# Patient Record
Sex: Female | Born: 1940 | Race: White | Hispanic: No | Marital: Married | Smoking: Never smoker
Health system: Southern US, Community
[De-identification: ages and names within clinical notes are randomized; demographics above are authoritative.]

## PROBLEM LIST (undated history)

## (undated) HISTORY — PX: BREAST EXCISIONAL BIOPSY: SUR124

---

## 1998-08-24 ENCOUNTER — Ambulatory Visit (HOSPITAL_BASED_OUTPATIENT_CLINIC_OR_DEPARTMENT_OTHER): Admission: RE | Admit: 1998-08-24 | Discharge: 1998-08-24 | Payer: Self-pay | Admitting: Orthopedic Surgery

## 1999-06-17 ENCOUNTER — Other Ambulatory Visit: Admission: RE | Admit: 1999-06-17 | Discharge: 1999-06-17 | Payer: Self-pay | Admitting: Obstetrics and Gynecology

## 1999-10-31 ENCOUNTER — Encounter: Payer: Self-pay | Admitting: Gastroenterology

## 1999-10-31 ENCOUNTER — Encounter: Admission: RE | Admit: 1999-10-31 | Discharge: 1999-10-31 | Payer: Self-pay | Admitting: Gastroenterology

## 2000-11-03 ENCOUNTER — Encounter: Payer: Self-pay | Admitting: Gastroenterology

## 2000-11-03 ENCOUNTER — Encounter: Admission: RE | Admit: 2000-11-03 | Discharge: 2000-11-03 | Payer: Self-pay | Admitting: Gastroenterology

## 2000-11-04 ENCOUNTER — Encounter: Admission: RE | Admit: 2000-11-04 | Discharge: 2000-11-04 | Payer: Self-pay | Admitting: Internal Medicine

## 2000-11-04 ENCOUNTER — Encounter: Payer: Self-pay | Admitting: Gastroenterology

## 2001-11-15 ENCOUNTER — Encounter: Admission: RE | Admit: 2001-11-15 | Discharge: 2001-11-15 | Payer: Self-pay | Admitting: Gastroenterology

## 2001-11-15 ENCOUNTER — Encounter: Payer: Self-pay | Admitting: Gastroenterology

## 2002-11-17 ENCOUNTER — Encounter: Payer: Self-pay | Admitting: Gastroenterology

## 2002-11-17 ENCOUNTER — Encounter: Admission: RE | Admit: 2002-11-17 | Discharge: 2002-11-17 | Payer: Self-pay | Admitting: Gastroenterology

## 2003-11-21 ENCOUNTER — Encounter: Admission: RE | Admit: 2003-11-21 | Discharge: 2003-11-21 | Payer: Self-pay | Admitting: Gastroenterology

## 2004-12-10 ENCOUNTER — Encounter: Admission: RE | Admit: 2004-12-10 | Discharge: 2004-12-10 | Payer: Self-pay | Admitting: Gastroenterology

## 2005-12-16 ENCOUNTER — Encounter: Admission: RE | Admit: 2005-12-16 | Discharge: 2005-12-16 | Payer: Self-pay | Admitting: Gastroenterology

## 2006-12-22 ENCOUNTER — Encounter: Admission: RE | Admit: 2006-12-22 | Discharge: 2006-12-22 | Payer: Self-pay | Admitting: Gastroenterology

## 2007-12-28 ENCOUNTER — Encounter: Admission: RE | Admit: 2007-12-28 | Discharge: 2007-12-28 | Payer: Self-pay | Admitting: Gastroenterology

## 2007-12-30 ENCOUNTER — Encounter: Admission: RE | Admit: 2007-12-30 | Discharge: 2007-12-30 | Payer: Self-pay | Admitting: Gastroenterology

## 2008-09-27 ENCOUNTER — Inpatient Hospital Stay (HOSPITAL_COMMUNITY): Admission: RE | Admit: 2008-09-27 | Discharge: 2008-10-04 | Payer: Self-pay | Admitting: Orthopedic Surgery

## 2009-01-16 ENCOUNTER — Encounter: Admission: RE | Admit: 2009-01-16 | Discharge: 2009-01-16 | Payer: Self-pay | Admitting: Internal Medicine

## 2010-01-17 ENCOUNTER — Encounter
Admission: RE | Admit: 2010-01-17 | Discharge: 2010-01-17 | Payer: Self-pay | Source: Home / Self Care | Admitting: Internal Medicine

## 2010-03-03 ENCOUNTER — Encounter: Payer: Self-pay | Admitting: Gastroenterology

## 2010-05-18 LAB — HEMOGLOBIN AND HEMATOCRIT, BLOOD
HCT: 25.4 % — ABNORMAL LOW (ref 36.0–46.0)
HCT: 26.5 % — ABNORMAL LOW (ref 36.0–46.0)
HCT: 27.9 % — ABNORMAL LOW (ref 36.0–46.0)
HCT: 29 % — ABNORMAL LOW (ref 36.0–46.0)
Hemoglobin: 11 g/dL — ABNORMAL LOW (ref 12.0–15.0)
Hemoglobin: 8.6 g/dL — ABNORMAL LOW (ref 12.0–15.0)
Hemoglobin: 8.7 g/dL — ABNORMAL LOW (ref 12.0–15.0)
Hemoglobin: 9 g/dL — ABNORMAL LOW (ref 12.0–15.0)
Hemoglobin: 9.5 g/dL — ABNORMAL LOW (ref 12.0–15.0)
Hemoglobin: 9.8 g/dL — ABNORMAL LOW (ref 12.0–15.0)

## 2010-05-18 LAB — PROTIME-INR
INR: 1.2 (ref 0.00–1.49)
Prothrombin Time: 15 seconds (ref 11.6–15.2)
Prothrombin Time: 27.8 seconds — ABNORMAL HIGH (ref 11.6–15.2)

## 2010-05-19 LAB — URINALYSIS, ROUTINE W REFLEX MICROSCOPIC
Glucose, UA: NEGATIVE mg/dL
Ketones, ur: NEGATIVE mg/dL
Nitrite: NEGATIVE
Specific Gravity, Urine: 1.021 (ref 1.005–1.030)
pH: 7 (ref 5.0–8.0)

## 2010-05-19 LAB — URINE CULTURE
Colony Count: 40000
Special Requests: NEGATIVE

## 2010-05-19 LAB — COMPREHENSIVE METABOLIC PANEL
AST: 29 U/L (ref 0–37)
Albumin: 3.8 g/dL (ref 3.5–5.2)
BUN: 21 mg/dL (ref 6–23)
Calcium: 9.1 mg/dL (ref 8.4–10.5)
Chloride: 108 mEq/L (ref 96–112)
Creatinine, Ser: 0.65 mg/dL (ref 0.4–1.2)
GFR calc Af Amer: >60 mL/min (ref >60)
Total Bilirubin: 0.5 mg/dL (ref 0.3–1.2)

## 2010-05-19 LAB — DIFFERENTIAL
Basophils Absolute: 0 10*3/uL (ref 0.0–0.1)
Lymphocytes Relative: 24 % (ref 12–46)
Lymphs Abs: 1.8 10*3/uL (ref 0.7–4.0)
Monocytes Absolute: 0.7 10*3/uL (ref 0.1–1.0)
Neutro Abs: 4.7 10*3/uL (ref 1.7–7.7)

## 2010-05-19 LAB — CBC
HCT: 44 % (ref 36.0–46.0)
MCHC: 33.4 g/dL (ref 30.0–36.0)
MCV: 93.2 fL (ref 78.0–100.0)
Platelets: 241 10*3/uL (ref 150–400)
RDW: 12.5 % (ref 11.5–15.5)
WBC: 7.4 10*3/uL (ref 4.0–10.5)

## 2010-05-19 LAB — PROTIME-INR: Prothrombin Time: 12.2 seconds (ref 11.6–15.2)

## 2010-05-19 LAB — APTT: aPTT: 27 seconds (ref 24–37)

## 2010-06-07 ENCOUNTER — Other Ambulatory Visit: Payer: Self-pay | Admitting: Dermatology

## 2010-06-25 NOTE — Op Note (Signed)
Sheri Blevins, Sheri Blevins                 ACCOUNT NO.:  [id number]   MEDICAL RECORD NO.:  [id number]          PATIENT TYPE:  INP   LOCATION:  0003                         FACILITY:  [facility]   PHYSICIAN:  Georges Lynch. Gioffre, M.D.DATE OF BIRTH:  [birth year]   DATE OF PROCEDURE:  09/27/2008  DATE OF DISCHARGE:                               OPERATIVE REPORT   SURGEON:  Georges Lynch. [name], M.D.   ASSISTANT:  Madlyn Frankel. Charlann Boxer, M.D.   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   OPERATION:  Right total hip arthroplasty utilizing the DePuy system.  I  utilized a size 2 Trilock femoral stem.  The pinnacle cup was a size 50-  mm cup with a 32-mm insert.  The insert was a polyethylene insert as I  mentioned and we did use the hole eliminator in the pinnacle cup.  The  pinnacle cup was affixed to one 6.5-mm cancellus screw.  The femoral  head was a ceramic head at +1, 32 mm diameter.  So basically we did a  ceramic head on a polyethylene liner.   PROCEDURE:  Under general anesthesia, the patient on left side, right  side up.  She had 600 mg of clindamycin IV.  At this time a sterile prep  and draping of the right hip was carried out.  Incision was made over  the posterolateral aspect of the right hip in the usual fashion.  The  self-retaining retractors were inserted.  The incision was carried down  to the iliotibial band.  The  iliotibial band was incised over the  greater trochanter and the muscle was dissected by blunt dissection  proximally.  I then exposed the external rotators and at this time I  partially detached the external rotators.  I then went down and  identified the femoral capsule.  I incised the capsule, dislocated the  femoral head, amputated the femoral head at the appropriate neck length.  I then utilized my box chisel to remove the cancellus  bone from the  greater trochanter.  I then used a widening reamer and then a canal  finder.  Following that, I then rasped the canal up to a size 2 Trilock  stem.  Following that, we then directed attention to the acetabulum and  reamed the acetabulum up to a size 49 for a 52-mm cup.  We then inserted  our pinnacle cup which was a 52-mm cup with 1 cancellus screw for  fixation.  We then inserted our trial liner, went through range of  motion and had excellent stability.  We then selected a standard Trilock  stem, size 2.  We selected that as a permanent stem and we then inserted  our permanent polyethylene liner.  Prior to inserting the liner, we did  insert the hole eliminator.  We then inserted our permanent Trilock  femoral stem, size 2 standard.  I then applied the ceramic head to the  Trilock stem and the ceramic head measured  a +1.  The diameter was a 32  mm diameter.  We reduced the hip after we made sure there was no soft  tissue into the acetabulum.  We irrigated the area out, reduced the hip  nicely and then closed the wound in layers in the usual fashion.  Note,  during the entire procedure with the trials and the final stem, we made  sure we had the appropriate neck length and stability.  The wound was  closed in layers in the usual fashion.  Sterile dressings were applied.  The patient left the operating room in satisfactory condition.   ESTIMATED BLOOD LOSS:  500 mL.           ______________________________  Georges Lynch. [name], M.D.     RAG/MEDQ  D:  09/27/2008  T:  09/27/2008  Job:  [id number]   cc:   Tasia Catchings, M.D.  Fax: [phone]

## 2010-06-25 NOTE — H&P (Signed)
NAME:  Sheri Blevins, Sheri Blevins                 ACCOUNT NO.:  [id number]   MEDICAL RECORD NO.:  [id number]           PATIENT TYPE:  INP   LOCATION:                               FACILITY:  [facility]   PHYSICIAN:  Georges Lynch. Gioffre, M.D.DATE OF BIRTH:  March 16, 1940   DATE OF ADMISSION:  09/27/2008  DATE OF DISCHARGE:                              HISTORY & PHYSICAL   CHIEF COMPLAINT:  Right hip pain,   HISTORY OF PRESENT ILLNESS:  Sheri Blevins came be seen Dr. Darrelyn Blevins for a  second opinion regarding pain in her right hip that was progressing and  causing pain to radiate down her legs as well as into her groin.  On  physical exam Dr. Darrelyn Blevins found that the patient had marked painful  limited range of motion of her right hip.  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  The patient now presents for a right total hip  arthroplasty.   ALLERGIES:  1. CODEINE.  2. AMOXICILLIN.  3. DARVON.  4. The patient states that she does have an allergy to LATEX.  The      patient denies any food or metal allergies.   CURRENT MEDICATIONS:  1. Paxil CR. The patient takes one tablet daily.  2. Note patient also takes many supplements.  I have discussed with      her that she needs to stop all of these seven days prior to the      surgery and remain off of them for three weeks post surgery until      she is off of Coumadin.   PRIMARY CARE PHYSICIAN:  Dr. Kevan Ny.   REVIEW OF SYSTEMS:  GENERAL  The patient denies fevers, weight change,  or night sweats.  HEENT/NEUROLOGIC:  The patient denies headache.  The  patient denies any balance problems or dizziness.  RESPIRATORY:  The  patient denies any shortness of breath, cough or wheezing.  CARDIOVASCULAR:  The patient denies chest pain, palpitations or  difficulty with breathing while lying flat.  GASTROINTESTINAL: The  patient  denies any nausea, vomiting, diarrhea, or blood in her stool.  MUSCULOSKELETAL:  The patient admits muscular pain in her low back and  legs.  The patient also admits morning stiffness and muscular weakness.   PAST SURGICAL HISTORY:  1. Colectomy.  About 12 inches of the patient's colon was removed.  2. Oophorectomy.  3. Appendectomy.  4. Total hysterectomy.   FAMILY HISTORY:  Father passed away at age 25 from a blood clot going to  his heart causing myocardial infarction.  Mother, 45, passed away from  pancreatic and stomach cancer.   SOCIAL HISTORY:  The patient is married.  She works at [employer] in  [place].  The patient denies any history of smoking, alcohol,  or drug use.  The patient states that her husband and family friends  will be able to help care for her after the surgery and she  plans to go  home.   PHYSICAL EXAMINATION:  VITAL SIGNS:  Pulse 64, respirations 16, blood  pressure 120/75.  GENERAL: A 70 year old white female, alert and oriented x3.  The patient  is in no acute distress.  She is a good historian.  HEENT: Unremarkable.  NECK: Supple, full range of motion without lymphadenopathy.  CHEST:  Lungs are clear to auscultation bilaterally.  Negative for  wheezing, rales, or rhonchi.  HEART:  Regular rate and rhythm without murmur, rub, or gallop.  ABDOMEN:  soft and nontender.  Bowel sounds present in all four  quadrants.  EXTREMITIES:  Right hip:  Decreased range of motion.  The patient also  has quite a bit of pain while going through range of motion.  The  patient has pain with resisted right hip flexion.  Otherwise the patient  has 5/5 strength in her lower extremities bilaterally.  Sensation is  intact in lower extremities bilaterally.  SKIN:  Unremarkable.  Peripheral vascular carotid pulses are 2+  bilaterally without carotid bruit.  Dorsalis pedis pulses are 2+  bilaterally.   IMPRESSION:  Severe degenerative arthritis of the right  hip.   PLAN:  The patient will undergo all routine labs and tests prior to  having a right total hip arthroplasty by Dr. Darrelyn Blevins to be assisted by  Dr. Charlann Boxer at [facility] on Wednesday, August 18,2010.      Rozell Searing, PAC    ______________________________  Georges Lynch Sheri Blevins, M.D.    LD/MEDQ  D:  09/25/2008  T:  09/25/2008  Job:  [id number]

## 2010-06-25 NOTE — Discharge Summary (Signed)
Sheri Blevins, Sheri Blevins                 ACCOUNT NO.:  [id number]   MEDICAL RECORD NO.:  [id number]          PATIENT TYPE:  INP   LOCATION:  1612                         FACILITY:  [facility]   PHYSICIAN:  Georges Lynch. Gioffre, M.D.DATE OF BIRTH:  06-11-1940   DATE OF ADMISSION:  09/27/2008  DATE OF DISCHARGE:  10/03/2008                               DISCHARGE SUMMARY   ADDENDUM:   PROJECTED DISCHARGE DATE:  October 03, 2008.   HISTORY OF PRESENT ILLNESS AND OPERATIVE REPORT:  See previous discharge  dictation.   DIET AND ACTIVITY:  Also remain the same.  Please see previous dictation  for these, as well.   HOSPITAL COURSE:  The patient had planned discharge to skilled nursing  facility set for October 02, 2008; however, due to insurance reasons, she  is still here.  Per the social worker, she is waiting on prior  authorization from her insurance to be discharged to the skilled nursing  facility.  The patient's vital signs remain stable.  The patient also  continues to do her physical therapy while in the hospital, and she  continues to meet her goals.   LABORATORY DATA:  The patient's hemoglobin and hematocrit remain stable.  Hemoglobin is 9.5, hematocrit 27.9.  The patient's INR is therapeutic at  this time at 2.9.   DISPOSITION:  To skilled nursing facility, hopefully today.   CONDITION ON DISCHARGE:  Doing well.      Sheri Blevins, PAC    ______________________________  Georges Lynch Sheri Blevins, M.D.    LD/MEDQ  D:  10/03/2008  T:  10/03/2008  Job:  [id number]

## 2010-06-25 NOTE — Discharge Summary (Signed)
Blevins, Sheri                 ACCOUNT NO.:  [id number]   MEDICAL RECORD NO.:  [id number]          PATIENT TYPE:  INP   LOCATION:  1612                         [facility]ACILITY:  [facility]   PHYSICIAN:  Georges Lynch. Gioffre, M.D.DATE OF BIRTH:  04-23-1940   DATE OF ADMISSION:  09/27/2008  DATE OF DISCHARGE:                               DISCHARGE SUMMARY   ADMISSION DIAGNOSES:  1. Severe degenerative arthritis right hip.  2. Anemia.  3. Anxiety.   DISCHARGE DIAGNOSES:  1. Severe degenerative arthritis of right hip status post right total      hip arthroplasty.  2. Anemia.  3. Anxiety.   HISTORY OF PRESENT ILLNESS:  Ms.  Sheri Blevins has been seen for Dr. [name] for  a second opinion regarding pain in her right hip that is progressing and  causing pain to radiate down her legs as well as into her groin.  On  physical exam, Dr. [name] found that the patient has marked painful  limited range of motion of her right hip.  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  The patient now presents for a total right hip  arthroplasty.   DRUG ALLERGIES:  1. CODEINE.  2. AMOXICILLIN  3. DARVON.  4. LATEX.   CURRENT MEDICATIONS:  1. Paxil CR 2 mg 1 tablet daily.  2. Ibuprofen 200 mg 1 tablet p.o. q. 4-6 hours p.r.n. pain.  The      patient to stop this medication while she is on Coumadin.  3. Tylenol Extra Strength 1 tablet p.o. q. 4-6 hours p.r.n. pain.  The      patient may continue this.  4. Fiber tablets two in the morning, three at nighttime.  5. Probiotics 2 tablets b.i.d.  6. Vitamin D3.  7. Co-Q 10 1 tablet p.o. in the morning.  8. Multivitamin 1 tablet in the morning.  9. Vitamin B12 1 tablet in the morning.  10.Liver antioxidant 1 tablet in the morning.  11.Calcium 1 tablet at nighttime.  12.Glucosamine 1 tablet p.o. twice daily.  13.Ferrous  sulfate 1 tablet p.o. daily.   SURGICAL PROCEDURES:  On September 27, 2008, the patient was taken to the  operating room by Dr. Ranee Gosselin, assisted by Dr. Durene Romans.  Under general anesthesia, the patient underwent a right total hip  arthroplasty.  The patient had no complications.   CONSULTANTS:  None.   LABORATORY DATA:  The patient's preoperative labs were done on September 21, 2008.  Her vital signs were normal.  The patient's complete blood  count revealed white count of 7.4, hemoglobin of 14.7, hematocrit of 44  and a platelet count of 241.  The patient's chemistry panel was  unremarkable.  The patient's urinalysis was also unremarkable.  Patient's PT/INR revealed protime of 12.2 and INR of 0.9.  throughout  the patient's hospital stay, serial H and H's were taken.  The patient's  hemoglobin dropped to as low as 8.6 on September 30, 2008, but by the  following day has come back up to 8.7 and now today and October 02, 2008,  was up to 9.0 with a hematocrit of 26.5.  The patient did not require  blood transfusion during her stay in the hospital.  The patient's PT/INR  were also drawn daily as she has been placed on Coumadin.  By September 30, 2008, the patient's INR was up to 1.8 with a protime of 21.1.  On October 01, 2008, patient's INR is 2.3 and remains on a therapeutic range today  on October 02, 2008, of 2.8 with a protime of 29.   HOSPITAL COURSE:  On September 27, 2008, the patient was admitted to [facility] under Dr. Libby Maw care.  The patient was taken to  the operating room where a right total hip arthroplasty was performed  without any complications.  The patient was then transferred to the  recovery room and then to the orthopedic floor in good condition  following a total right hip protocol with IV antibiotics of clindamycin  600 mg, clindamycin IV, she received three doses of this.  Patient then  incurred a total of 5 days postoperative care in which  the patient has  progressed nicely with physical therapy.  She was able to transition  from IV medications to p.o. medications without any difficulty.  The  patient's vital signs remained stable and she remains afebrile.  The  patient did develop some mild postoperative anemia, but this was able to  correct itself, and she did not require transfusion.  The patient's  wound remained benign without any signs of infection, well approximated  with staples.  On postoperative day #1, the patient was able to ambulate  24 feet and then again 20 feet using a walker with minimal assistance,  and by postoperative day #3, the patient was able to ambulate 110 feet  using a walker and without assistance.  The patient is also able to  perform transfers of sit-to-stand and stand-to-sit without any  difficulty.   DISCHARGE/PLAN:  1. The patient is to be discharged to skilled nursing facility where      she will continue physical therapy, occupational therapy and      completion of ADLs.  2. The patient's dressing is to be changed daily and to be checked      daily for any signs of infection.  3. Diet.  The patient has no restrictions.  4. Activity.  The patient is full weightbearing with walker with      assistance.   FOLLOWUP:  The patient has scheduled follow-up appointment Dr. [name]  in two weeks from the day of surgery.  She is call [phone] for follow-  up appointment.   DISCHARGE MEDICATIONS:  1. Robaxin 500 mg 1 tablet p.o. t.i.d. p.r.n. muscle spasms.  2. Dilaudid 2 mg 1 tablet p.o. q.4-6 hours p.r.n. pain.  3. Coumadin.  This is to be dosed per the skilled nursing facility      pharmacy.  Therapeutic ranges between 2-3.  4. Paxil CR 2.5 mg one type p.o. daily.  5. Tylenol Extra Strength 1 tablet p.o. q.6 h p.r.n. pain.  6. Fiber tablets 2 in the morning, 3 at night.   CONDITION ON DISCHARGE:  The patient's condition on discharge is listed  as improving.      Rozell Searing,  PAC    ______________________________  Georges Lynch [name], M.D.    LD/MEDQ  D:  10/02/2008  T:  10/02/2008  Job:  [id number]

## 2011-03-26 DIAGNOSIS — E7800 Pure hypercholesterolemia, unspecified: Secondary | ICD-10-CM | POA: Diagnosis not present

## 2011-03-26 DIAGNOSIS — E039 Hypothyroidism, unspecified: Secondary | ICD-10-CM | POA: Diagnosis not present

## 2011-03-26 DIAGNOSIS — F411 Generalized anxiety disorder: Secondary | ICD-10-CM | POA: Diagnosis not present

## 2011-03-26 DIAGNOSIS — M1990 Unspecified osteoarthritis, unspecified site: Secondary | ICD-10-CM | POA: Diagnosis not present

## 2011-04-21 DIAGNOSIS — IMO0002 Reserved for concepts with insufficient information to code with codable children: Secondary | ICD-10-CM | POA: Diagnosis not present

## 2011-05-16 ENCOUNTER — Other Ambulatory Visit: Payer: Self-pay | Admitting: Internal Medicine

## 2011-05-16 DIAGNOSIS — Z1231 Encounter for screening mammogram for malignant neoplasm of breast: Secondary | ICD-10-CM

## 2011-06-18 ENCOUNTER — Ambulatory Visit
Admission: RE | Admit: 2011-06-18 | Discharge: 2011-06-18 | Disposition: A | Discharge status: Home / Self Care | Payer: Medicare Other | Source: Ambulatory Visit | Attending: Internal Medicine | Admitting: Internal Medicine

## 2011-06-18 DIAGNOSIS — Z1231 Encounter for screening mammogram for malignant neoplasm of breast: Secondary | ICD-10-CM | POA: Diagnosis not present

## 2011-07-22 DIAGNOSIS — Z0000 Encounter for general adult medical examination without abnormal findings: Secondary | ICD-10-CM | POA: Diagnosis not present

## 2011-07-22 DIAGNOSIS — Z79899 Other long term (current) drug therapy: Secondary | ICD-10-CM | POA: Diagnosis not present

## 2011-07-22 DIAGNOSIS — Z1331 Encounter for screening for depression: Secondary | ICD-10-CM | POA: Diagnosis not present

## 2011-07-22 DIAGNOSIS — E7800 Pure hypercholesterolemia, unspecified: Secondary | ICD-10-CM | POA: Diagnosis not present

## 2011-07-22 DIAGNOSIS — M2550 Pain in unspecified joint: Secondary | ICD-10-CM | POA: Diagnosis not present

## 2011-07-22 DIAGNOSIS — E039 Hypothyroidism, unspecified: Secondary | ICD-10-CM | POA: Diagnosis not present

## 2011-07-22 DIAGNOSIS — E559 Vitamin D deficiency, unspecified: Secondary | ICD-10-CM | POA: Diagnosis not present

## 2011-07-22 DIAGNOSIS — IMO0002 Reserved for concepts with insufficient information to code with codable children: Secondary | ICD-10-CM | POA: Diagnosis not present

## 2011-09-19 DIAGNOSIS — M1610 Unilateral primary osteoarthritis, unspecified hip: Secondary | ICD-10-CM | POA: Diagnosis not present

## 2011-09-19 DIAGNOSIS — M169 Osteoarthritis of hip, unspecified: Secondary | ICD-10-CM | POA: Diagnosis not present

## 2011-12-16 DIAGNOSIS — D239 Other benign neoplasm of skin, unspecified: Secondary | ICD-10-CM | POA: Diagnosis not present

## 2011-12-16 DIAGNOSIS — L919 Hypertrophic disorder of the skin, unspecified: Secondary | ICD-10-CM | POA: Diagnosis not present

## 2011-12-16 DIAGNOSIS — D230 Other benign neoplasm of skin of lip: Secondary | ICD-10-CM | POA: Diagnosis not present

## 2011-12-16 DIAGNOSIS — L909 Atrophic disorder of skin, unspecified: Secondary | ICD-10-CM | POA: Diagnosis not present

## 2011-12-16 DIAGNOSIS — B079 Viral wart, unspecified: Secondary | ICD-10-CM | POA: Diagnosis not present

## 2011-12-16 DIAGNOSIS — L821 Other seborrheic keratosis: Secondary | ICD-10-CM | POA: Diagnosis not present

## 2011-12-16 DIAGNOSIS — D1801 Hemangioma of skin and subcutaneous tissue: Secondary | ICD-10-CM | POA: Diagnosis not present

## 2011-12-16 DIAGNOSIS — Z85828 Personal history of other malignant neoplasm of skin: Secondary | ICD-10-CM | POA: Diagnosis not present

## 2012-01-15 DIAGNOSIS — K589 Irritable bowel syndrome without diarrhea: Secondary | ICD-10-CM | POA: Diagnosis not present

## 2012-01-15 DIAGNOSIS — IMO0002 Reserved for concepts with insufficient information to code with codable children: Secondary | ICD-10-CM | POA: Diagnosis not present

## 2012-01-15 DIAGNOSIS — Z0000 Encounter for general adult medical examination without abnormal findings: Secondary | ICD-10-CM | POA: Diagnosis not present

## 2012-01-15 DIAGNOSIS — E538 Deficiency of other specified B group vitamins: Secondary | ICD-10-CM | POA: Diagnosis not present

## 2012-01-15 DIAGNOSIS — E039 Hypothyroidism, unspecified: Secondary | ICD-10-CM | POA: Diagnosis not present

## 2012-01-15 DIAGNOSIS — E7800 Pure hypercholesterolemia, unspecified: Secondary | ICD-10-CM | POA: Diagnosis not present

## 2012-01-15 DIAGNOSIS — F411 Generalized anxiety disorder: Secondary | ICD-10-CM | POA: Diagnosis not present

## 2012-01-15 DIAGNOSIS — E559 Vitamin D deficiency, unspecified: Secondary | ICD-10-CM | POA: Diagnosis not present

## 2012-02-02 DIAGNOSIS — H53029 Refractive amblyopia, unspecified eye: Secondary | ICD-10-CM | POA: Diagnosis not present

## 2012-02-02 DIAGNOSIS — Z961 Presence of intraocular lens: Secondary | ICD-10-CM | POA: Diagnosis not present

## 2012-05-19 ENCOUNTER — Other Ambulatory Visit: Payer: Self-pay

## 2012-05-19 DIAGNOSIS — Z1231 Encounter for screening mammogram for malignant neoplasm of breast: Secondary | ICD-10-CM

## 2012-06-22 ENCOUNTER — Ambulatory Visit: Payer: Medicare Other

## 2012-06-29 ENCOUNTER — Ambulatory Visit
Admission: RE | Admit: 2012-06-29 | Discharge: 2012-06-29 | Disposition: A | Discharge status: Home / Self Care | Payer: BC Managed Care – PPO | Source: Ambulatory Visit

## 2012-06-29 DIAGNOSIS — Z1231 Encounter for screening mammogram for malignant neoplasm of breast: Secondary | ICD-10-CM

## 2012-07-27 DIAGNOSIS — K589 Irritable bowel syndrome without diarrhea: Secondary | ICD-10-CM | POA: Diagnosis not present

## 2012-07-27 DIAGNOSIS — E559 Vitamin D deficiency, unspecified: Secondary | ICD-10-CM | POA: Diagnosis not present

## 2012-07-27 DIAGNOSIS — Z1331 Encounter for screening for depression: Secondary | ICD-10-CM | POA: Diagnosis not present

## 2012-07-27 DIAGNOSIS — Z23 Encounter for immunization: Secondary | ICD-10-CM | POA: Diagnosis not present

## 2012-07-27 DIAGNOSIS — Z79899 Other long term (current) drug therapy: Secondary | ICD-10-CM | POA: Diagnosis not present

## 2012-07-27 DIAGNOSIS — E7800 Pure hypercholesterolemia, unspecified: Secondary | ICD-10-CM | POA: Diagnosis not present

## 2012-07-27 DIAGNOSIS — Z0000 Encounter for general adult medical examination without abnormal findings: Secondary | ICD-10-CM | POA: Diagnosis not present

## 2012-07-27 DIAGNOSIS — E039 Hypothyroidism, unspecified: Secondary | ICD-10-CM | POA: Diagnosis not present

## 2012-07-27 DIAGNOSIS — E538 Deficiency of other specified B group vitamins: Secondary | ICD-10-CM | POA: Diagnosis not present

## 2012-07-27 DIAGNOSIS — IMO0002 Reserved for concepts with insufficient information to code with codable children: Secondary | ICD-10-CM | POA: Diagnosis not present

## 2012-09-13 DIAGNOSIS — Z96649 Presence of unspecified artificial hip joint: Secondary | ICD-10-CM | POA: Diagnosis not present

## 2012-10-27 DIAGNOSIS — Z85828 Personal history of other malignant neoplasm of skin: Secondary | ICD-10-CM | POA: Diagnosis not present

## 2012-10-27 DIAGNOSIS — D230 Other benign neoplasm of skin of lip: Secondary | ICD-10-CM | POA: Diagnosis not present

## 2012-10-27 DIAGNOSIS — D239 Other benign neoplasm of skin, unspecified: Secondary | ICD-10-CM | POA: Diagnosis not present

## 2012-10-27 DIAGNOSIS — L739 Follicular disorder, unspecified: Secondary | ICD-10-CM | POA: Diagnosis not present

## 2012-10-27 DIAGNOSIS — D1801 Hemangioma of skin and subcutaneous tissue: Secondary | ICD-10-CM | POA: Diagnosis not present

## 2012-10-27 DIAGNOSIS — L819 Disorder of pigmentation, unspecified: Secondary | ICD-10-CM | POA: Diagnosis not present

## 2012-10-27 DIAGNOSIS — L909 Atrophic disorder of skin, unspecified: Secondary | ICD-10-CM | POA: Diagnosis not present

## 2012-10-27 DIAGNOSIS — L821 Other seborrheic keratosis: Secondary | ICD-10-CM | POA: Diagnosis not present

## 2012-11-08 DIAGNOSIS — M2030 Hallux varus (acquired), unspecified foot: Secondary | ICD-10-CM | POA: Diagnosis not present

## 2012-11-08 DIAGNOSIS — M216X9 Other acquired deformities of unspecified foot: Secondary | ICD-10-CM | POA: Diagnosis not present

## 2013-01-26 DIAGNOSIS — Z6827 Body mass index (BMI) 27.0-27.9, adult: Secondary | ICD-10-CM | POA: Diagnosis not present

## 2013-01-26 DIAGNOSIS — J029 Acute pharyngitis, unspecified: Secondary | ICD-10-CM | POA: Diagnosis not present

## 2013-01-26 DIAGNOSIS — J069 Acute upper respiratory infection, unspecified: Secondary | ICD-10-CM | POA: Diagnosis not present

## 2013-05-24 ENCOUNTER — Other Ambulatory Visit: Payer: Self-pay

## 2013-05-24 DIAGNOSIS — Z1231 Encounter for screening mammogram for malignant neoplasm of breast: Secondary | ICD-10-CM

## 2013-07-01 ENCOUNTER — Ambulatory Visit
Admission: RE | Admit: 2013-07-01 | Discharge: 2013-07-01 | Disposition: A | Discharge status: Home / Self Care | Payer: BC Managed Care – PPO | Source: Ambulatory Visit

## 2013-07-01 DIAGNOSIS — Z1231 Encounter for screening mammogram for malignant neoplasm of breast: Secondary | ICD-10-CM

## 2013-08-15 DIAGNOSIS — E559 Vitamin D deficiency, unspecified: Secondary | ICD-10-CM | POA: Diagnosis not present

## 2013-08-15 DIAGNOSIS — E7800 Pure hypercholesterolemia, unspecified: Secondary | ICD-10-CM | POA: Diagnosis not present

## 2013-08-15 DIAGNOSIS — Z23 Encounter for immunization: Secondary | ICD-10-CM | POA: Diagnosis not present

## 2013-08-15 DIAGNOSIS — E538 Deficiency of other specified B group vitamins: Secondary | ICD-10-CM | POA: Diagnosis not present

## 2013-08-15 DIAGNOSIS — J069 Acute upper respiratory infection, unspecified: Secondary | ICD-10-CM | POA: Diagnosis not present

## 2013-08-15 DIAGNOSIS — R32 Unspecified urinary incontinence: Secondary | ICD-10-CM | POA: Diagnosis not present

## 2013-08-15 DIAGNOSIS — E039 Hypothyroidism, unspecified: Secondary | ICD-10-CM | POA: Diagnosis not present

## 2013-08-15 DIAGNOSIS — Z0000 Encounter for general adult medical examination without abnormal findings: Secondary | ICD-10-CM | POA: Diagnosis not present

## 2013-09-20 DIAGNOSIS — Z96649 Presence of unspecified artificial hip joint: Secondary | ICD-10-CM | POA: Diagnosis not present

## 2013-11-08 DIAGNOSIS — L538 Other specified erythematous conditions: Secondary | ICD-10-CM | POA: Diagnosis not present

## 2013-11-08 DIAGNOSIS — Z85828 Personal history of other malignant neoplasm of skin: Secondary | ICD-10-CM | POA: Diagnosis not present

## 2013-11-08 DIAGNOSIS — D235 Other benign neoplasm of skin of trunk: Secondary | ICD-10-CM | POA: Diagnosis not present

## 2013-11-08 DIAGNOSIS — L819 Disorder of pigmentation, unspecified: Secondary | ICD-10-CM | POA: Diagnosis not present

## 2013-11-08 DIAGNOSIS — I789 Disease of capillaries, unspecified: Secondary | ICD-10-CM | POA: Diagnosis not present

## 2013-11-08 DIAGNOSIS — D1801 Hemangioma of skin and subcutaneous tissue: Secondary | ICD-10-CM | POA: Diagnosis not present

## 2013-11-08 DIAGNOSIS — L821 Other seborrheic keratosis: Secondary | ICD-10-CM | POA: Diagnosis not present

## 2013-11-23 ENCOUNTER — Other Ambulatory Visit: Payer: Self-pay | Admitting: Internal Medicine

## 2013-11-23 ENCOUNTER — Ambulatory Visit
Admission: RE | Admit: 2013-11-23 | Discharge: 2013-11-23 | Disposition: A | Discharge status: Home / Self Care | Payer: BC Managed Care – PPO | Source: Ambulatory Visit | Attending: Internal Medicine | Admitting: Internal Medicine

## 2013-11-23 DIAGNOSIS — M79673 Pain in unspecified foot: Secondary | ICD-10-CM | POA: Diagnosis not present

## 2013-11-23 DIAGNOSIS — M79671 Pain in right foot: Secondary | ICD-10-CM

## 2014-03-13 DIAGNOSIS — Z961 Presence of intraocular lens: Secondary | ICD-10-CM | POA: Diagnosis not present

## 2014-05-26 ENCOUNTER — Other Ambulatory Visit: Payer: Self-pay

## 2014-05-26 DIAGNOSIS — Z1231 Encounter for screening mammogram for malignant neoplasm of breast: Secondary | ICD-10-CM

## 2014-07-18 ENCOUNTER — Ambulatory Visit: Payer: Medicare Other

## 2014-08-22 ENCOUNTER — Ambulatory Visit: Payer: Medicare Other

## 2014-09-19 DIAGNOSIS — D81819 Biotin-dependent carboxylase deficiency, unspecified: Secondary | ICD-10-CM | POA: Diagnosis not present

## 2014-09-19 DIAGNOSIS — E559 Vitamin D deficiency, unspecified: Secondary | ICD-10-CM | POA: Diagnosis not present

## 2014-09-19 DIAGNOSIS — Z79899 Other long term (current) drug therapy: Secondary | ICD-10-CM | POA: Diagnosis not present

## 2014-09-19 DIAGNOSIS — E039 Hypothyroidism, unspecified: Secondary | ICD-10-CM | POA: Diagnosis not present

## 2014-09-19 DIAGNOSIS — Z0001 Encounter for general adult medical examination with abnormal findings: Secondary | ICD-10-CM | POA: Diagnosis not present

## 2014-09-19 DIAGNOSIS — F419 Anxiety disorder, unspecified: Secondary | ICD-10-CM | POA: Diagnosis not present

## 2014-09-19 DIAGNOSIS — Z1389 Encounter for screening for other disorder: Secondary | ICD-10-CM | POA: Diagnosis not present

## 2014-09-19 DIAGNOSIS — K219 Gastro-esophageal reflux disease without esophagitis: Secondary | ICD-10-CM | POA: Diagnosis not present

## 2014-09-19 DIAGNOSIS — E780 Pure hypercholesterolemia: Secondary | ICD-10-CM | POA: Diagnosis not present

## 2014-09-19 DIAGNOSIS — R32 Unspecified urinary incontinence: Secondary | ICD-10-CM | POA: Diagnosis not present

## 2014-11-10 DIAGNOSIS — L821 Other seborrheic keratosis: Secondary | ICD-10-CM | POA: Diagnosis not present

## 2014-11-10 DIAGNOSIS — D1801 Hemangioma of skin and subcutaneous tissue: Secondary | ICD-10-CM | POA: Diagnosis not present

## 2014-11-10 DIAGNOSIS — D225 Melanocytic nevi of trunk: Secondary | ICD-10-CM | POA: Diagnosis not present

## 2014-11-10 DIAGNOSIS — L814 Other melanin hyperpigmentation: Secondary | ICD-10-CM | POA: Diagnosis not present

## 2014-11-10 DIAGNOSIS — Z85828 Personal history of other malignant neoplasm of skin: Secondary | ICD-10-CM | POA: Diagnosis not present

## 2014-11-29 DIAGNOSIS — M6281 Muscle weakness (generalized): Secondary | ICD-10-CM | POA: Diagnosis not present

## 2014-11-29 DIAGNOSIS — R269 Unspecified abnormalities of gait and mobility: Secondary | ICD-10-CM | POA: Diagnosis not present

## 2014-11-30 ENCOUNTER — Ambulatory Visit: Payer: BLUE CROSS/BLUE SHIELD | Attending: Orthopedic Surgery | Admitting: Physical Therapy

## 2014-11-30 ENCOUNTER — Encounter: Payer: Self-pay | Admitting: Physical Therapy

## 2014-11-30 DIAGNOSIS — R269 Unspecified abnormalities of gait and mobility: Secondary | ICD-10-CM | POA: Diagnosis not present

## 2014-11-30 DIAGNOSIS — R29898 Other symptoms and signs involving the musculoskeletal system: Secondary | ICD-10-CM | POA: Insufficient documentation

## 2014-11-30 DIAGNOSIS — R2681 Unsteadiness on feet: Secondary | ICD-10-CM | POA: Diagnosis not present

## 2014-12-05 ENCOUNTER — Ambulatory Visit: Payer: Medicare Other | Admitting: Physical Therapy

## 2014-12-06 NOTE — Therapy (Signed)
[facility] [address] [place], [place], [address] Phone: [phone]   Fax:  [phone]  Physical Therapy Evaluation  Patient Details  Name: Sheri Blevins MRN: [id number] Date of Birth: 1940/02/22 Referring Provider: Dr. Latanya Maudlin  Encounter Date: 11/30/2014      PT End of Session - 12/06/14 0951    Visit Number 1  G1   Number of Visits 9   Date for PT Re-Evaluation 12/31/14   Authorization Type BCBS/Medicare    PT Start Time 1102   PT Stop Time 1150   PT Time Calculation (min) 48 min      History reviewed. No pertinent past medical history.  History reviewed. No pertinent past surgical history.  There were no vitals filed for this visit.  Visit Diagnosis:  Weakness of left leg - Plan: PT plan of care cert/re-cert  Abnormality of gait - Plan: PT plan of care cert/re-cert  Unsteadiness on feet - Plan: PT plan of care cert/re-cert      Subjective Assessment - 12/06/14 0926    Subjective Pt reports LLE soreness - reports muscle tightness starts with stress in upper back and radiates down her L leg - goes to massage therapist once a month; pt reports weakness and difficulty walking started several months ago; orthopedic PA states that she is bow-legged (genu valgus) due to the OA in her hips                                                                                                                                                         Pt states she was told by orthopedic PA to try  PT for 4 weeks and then if not stronger, a referral to neurology will be made   Pertinent History R THR August  2010; surgery on L foot for bunionectomy and hammer toe in 2011; osteopenia   Patient Stated Goals get L leg stronger and walk better   Currently in Pain? No/denies            [facility] PT Assessment - 12/06/14 0001    Assessment   Medical Diagnosis Balance dysfunction; Weakness in LE's   Referring  Provider Dr. Latanya Maudlin   Onset Date/Surgical Date --  Summer 2016   Prior Therapy 6 weeks for PT after R THR in 2010   Precautions   Precautions None   Balance Screen   Has the patient fallen in the past 6 months No   Has the patient had a decrease in activity level because of a fear of falling?  Yes   Is the patient reluctant to leave their home because of a fear of falling?  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   Strength   Overall Strength Within functional limits for tasks performed  for RLE   Strength Assessment Site Hip;Knee;Ankle   Right Hip ABduction 4-/5   Left Hip Flexion 3+/5   Left Hip Extension 3+/5   Left Hip ABduction 3+/5   Right Knee Flexion 5/5   Left Knee Flexion 5/5   Left Knee Extension 4-/5   Transfers   Transfers Sit to Stand   Sit to Stand 6: Modified independent (Device/Increase time)   Ambulation/Gait   Ambulation/Gait Yes   Ambulation/Gait Assistance 6: Modified independent (Device/Increase time)   Ambulation Distance (Feet) 130 Feet   Assistive device None   Gait Pattern Within Functional Limits   Ambulation Surface Level;Indoor   Gait velocity 2.79  11.75 seconds - no device   Timed Up and Go Test   Normal TUG (seconds) 11.75  no device   High Level Balance   High Level Balance Comments R SLS = 4.65 se cs:  L SLS =1.03 secs                                PT Long Term Goals - 12/06/14 0955    PT LONG TERM GOAL #1   Title Pt will report at least 25% improvement in strength and steadiness with ambulation.  (12-31-14)   Time 4   Period Weeks    Status New   PT LONG TERM GOAL #2   Title Negotiate steps using a step over step sequence without use of rail to demo incr. strength and balance.  (12-31-14)   Time 4   Period Weeks   Status New   PT LONG TERM GOAL #3   Title Increase gait velocity to >/= 3.5 ft/sec without device for improved gait efficiency.  (12-31-14)   Baseline 2.79 ft/sec   Time 4   Period Weeks   Status New   PT LONG TERM GOAL #4   Title Independent in HEP for LLE strengthening.  (12-31-14)   Time 4   Period Weeks   Status New               Plan - 12/06/14 3300    Clinical Impression Statement Pt has significant L hip musculature weakness of unknown etiology resulting in gait deviaitons and decreased balance.  Pt has been instructed to try PT for 4 weeks to incr. strength and if  insufficient progress achieved referall to neurology will be made   Pt will benefit from skilled therapeutic intervention in order to improve on the following deficits Abnormal gait;Decreased balance;Decreased strength   Rehab Potential Good   PT Frequency 2x / week   PT Duration 4 weeks   PT Treatment/Interventions ADLs/Self Care Home Management;Gait training;Stair training;Functional mobility training;Therapeutic activities;Therapeutic exercise;Balance training;Patient/family education;Neuromuscular re-education   PT Next Visit Plan begin HEP for L hip musculature strengthening and balance   PT Home Exercise Plan see above   Consulted and Agree with Plan of Care Patient         Problem List There are no active problems to display for this patient.   Cami Delawder,  [place], PT 12/06/2014, 10:04 AM  [facility] [address] [place], [place], [address] Phone: [phone]   Fax:  [phone]  Name: SINCERITY CEDAR MRN: [id number] Date of Birth: 1940/03/11

## 2014-12-07 ENCOUNTER — Ambulatory Visit: Payer: BLUE CROSS/BLUE SHIELD | Admitting: Physical Therapy

## 2014-12-07 DIAGNOSIS — R269 Unspecified abnormalities of gait and mobility: Secondary | ICD-10-CM | POA: Diagnosis not present

## 2014-12-07 DIAGNOSIS — R2681 Unsteadiness on feet: Secondary | ICD-10-CM | POA: Diagnosis not present

## 2014-12-07 DIAGNOSIS — R29898 Other symptoms and signs involving the musculoskeletal system: Secondary | ICD-10-CM | POA: Diagnosis not present

## 2014-12-07 NOTE — Patient Instructions (Addendum)
Bridging    Slowly raise buttocks from floor, keeping stomach tight. Repeat ___10_ times per set. Do ___1_ sets per session. Do ___1_ sessions per day.  http://orth.exer.us/1097   Copyright  VHI. All rights reserved.  Hip Flexion / Knee Extension: Straight-Leg Raise (Eccentric)    Lie on back. Lift leg with knee straight. Slowly lower leg for 3-5 seconds. __10_ reps per set, _1__ sets per day, _5__ days per week. Lower like elevator, stopping at each floor. Add _2__ lbs when you achieve __10_ repetitions. Rest on elbows. Rest on straight arms.  Copyright  VHI. All rights reserved.

## 2014-12-11 ENCOUNTER — Encounter: Payer: Self-pay | Admitting: Physical Therapy

## 2014-12-11 NOTE — Therapy (Signed)
[facility] [address] [place], [place], [address] Phone: [phone]   Fax:  [phone]  Physical Therapy Treatment  Patient Details  Name: Sheri Blevins MRN: [id number] Date of Birth: Jun 29, 1940 Referring Provider: Dr. Latanya Maudlin  Encounter Date: 12/07/2014      PT End of Session - 12/11/14 1912    Visit Number 2  G2   Number of Visits 9   Date for PT Re-Evaluation 12/31/14   Authorization Type BCBS/Medicare    PT Start Time 0802   PT Stop Time 0849   PT Time Calculation (min) 47 min      History reviewed. No pertinent past medical history.  History reviewed. No pertinent past surgical history.  There were no vitals filed for this visit.  Visit Diagnosis:  Weakness of left leg  Abnormality of gait      Subjective Assessment - 12/11/14 1903    Subjective Pt states she feels she is walking a little better than she was at time of initial evaluation - feels legs are a little stronger   Pertinent History R THR August  2010; surgery on L foot for bunionectomy and hammer toe in 2011; osteopenia   Patient Stated Goals get L leg stronger and walk better   Currently in Pain? No/denies                         [facility] Adult PT Treatment/Exercise - 12/11/14 0001    Transfers   Transfers Sit to Stand   Sit to Stand With upper extremity assist;5: Supervision  from mat - 5 reps   Lumbar Exercises: Stretches   Active Hamstring Stretch 1 rep;20 seconds  LLE   Lumbar Exercises: Aerobic   Stationary Bike SciFit level 1.5 x 5"   Lumbar Exercises: Standing   Heel Raises 10 reps   Lumbar Exercises: Supine   Clam 10 reps  3# LLE   Bent Knee Raise 10 reps  2# LLE   Bridge 10 reps   Straight Leg Raise 10 reps  2#   Lumbar Exercises: Sidelying   Hip Abduction 10 reps  No weight   Hip Abduction Limitations weakness   Lumbar Exercises: Prone   Straight Leg Raise 10 reps   Other Prone  Lumbar Exercises hip extension with knee flexed at 90 degrees   10 reps                PT Education - 12/11/14 1911    Education provided Yes   Education Details hip flexion, abduction, and extension for HEP   Person(s) Educated Patient   Methods Explanation;Demonstration;Handout   Comprehension Verbalized understanding;Returned demonstration             PT Long Term Goals - 12/06/14 0955    PT LONG TERM GOAL #1   Title Pt will report at least 25% improvement in strength and steadiness with ambulation.  (12-31-14)   Time 4   Period Weeks   Status New   PT LONG TERM GOAL #2   Title Negotiate steps using a step over step sequence without use of rail to demo incr. strength and balance.  (12-31-14)   Time 4   Period Weeks   Status New   PT LONG TERM GOAL #3   Title Increase gait velocity to >/= 3.5 ft/sec without device for improved gait efficiency.  (12-31-14)   Baseline 2.79 ft/sec   Time 4   Period Weeks  Status New   PT LONG TERM GOAL #4   Title Independent in HEP for LLE strengthening.  (12-31-14)   Time 4   Period Weeks   Status New               Plan - 12/11/14 1913    Clinical Impression Statement Pt fatigued easily with use of weights on LLE - indicative of decr. muscle endurance and hip musculature weakness   Pt will benefit from skilled therapeutic intervention in order to improve on the following deficits Abnormal gait;Decreased balance;Decreased strength   Rehab Potential Good   PT Frequency 2x / week   PT Duration 4 weeks   PT Treatment/Interventions ADLs/Self Care Home Management;Gait training;Stair training;Functional mobility training;Therapeutic activities;Therapeutic exercise;Balance training;Patient/family education;Neuromuscular re-education   PT Next Visit Plan check HEP   PT Home Exercise Plan see above   Consulted and Agree with Plan of Care Patient        Problem List There are no active problems to display for this  patient.   [id number], FSELT [id number], PT 12/11/2014, 7:20 PM  [facility] [address] [place], [place], [address] Phone: [phone]   Fax:  [phone]  Name: Sheri Blevins MRN: [id number] Date of Birth: 04/21/1940

## 2014-12-12 ENCOUNTER — Ambulatory Visit: Payer: BLUE CROSS/BLUE SHIELD | Attending: Orthopedic Surgery | Admitting: Physical Therapy

## 2014-12-12 DIAGNOSIS — R29898 Other symptoms and signs involving the musculoskeletal system: Secondary | ICD-10-CM | POA: Diagnosis not present

## 2014-12-12 DIAGNOSIS — R269 Unspecified abnormalities of gait and mobility: Secondary | ICD-10-CM | POA: Insufficient documentation

## 2014-12-12 DIAGNOSIS — R2681 Unsteadiness on feet: Secondary | ICD-10-CM | POA: Diagnosis not present

## 2014-12-13 ENCOUNTER — Encounter: Payer: Self-pay | Admitting: Physical Therapy

## 2014-12-13 NOTE — Therapy (Signed)
[facility] [address] [place], [place], [address] Phone: [phone]   Fax:  [phone]  Physical Therapy Treatment  Patient Details  Name: Sheri Blevins MRN: [id number] Date of Birth: 1940/10/26 Referring Provider: Dr. Latanya Blevins  Encounter Date: 12/12/2014      PT End of Session - 12/13/14 1144    Visit Number 3   Number of Visits 9   Date for PT Re-Evaluation 12/31/14   Authorization Type BCBS/Medicare    PT Start Time 0803   PT Stop Time 6599   PT Time Calculation (min) 44 min      History reviewed. No pertinent past medical history.  History reviewed. No pertinent past surgical history.  There were no vitals filed for this visit.  Visit Diagnosis:  Weakness of left leg  Abnormality of gait      Subjective Assessment - 12/13/14 1138    Subjective Pt states she has done some exercises at home but gets tired doing them "I can tell my legs are weak"   Pertinent History R THR August  2010; surgery on L foot for bunionectomy and hammer toe in 2011; osteopenia   Patient Stated Goals get L leg stronger and walk better   Currently in Pain? No/denies                         [facility] Adult PT Treatment/Exercise - 12/13/14 0001    Lumbar Exercises: Aerobic   Stationary Bike SciFit level 2.0 x 5"   Lumbar Exercises: Machines for Strengthening   Leg Press 40# bil. LE 20 reps; LLE only 20# 20 reps   Lumbar Exercises: Standing   Heel Raises 10 reps   Lumbar Exercises: Seated   Sit to Stand 10 reps  from mat; then attempted from wooden step 18 3/4" high   Sit to Stand Limitations bil. hip extension weakness   Lumbar Exercises: Supine   Clam 10 reps  2# weight used   Bent Knee Raise 10 reps  2# weight used   Bridge 10 reps   Straight Leg Raise 10 reps   Other Supine Lumbar Exercises Bridging with hip abdct/adduction and with marching x 5 reps each; bridging with LE extension x 5 reps  each leg   Lumbar Exercises: Sidelying   Hip Abduction 10 reps  2# weight used   Other Sidelying Lumbar Exercises Pilates ex - hip abduction with clockwise and counterclockwise circles x 5 reps each   Lumbar Exercises: Prone   Straight Leg Raise 10 reps   Other Prone Lumbar Exercises hip extension with knee flexed at 90 degrees   10 reps                     PT Long Term Goals - 12/06/14 0955    PT LONG TERM GOAL #1   Title Pt will report at least 25% improvement in strength and steadiness with ambulation.  (12-31-14)   Time 4   Period Weeks   Status New   PT LONG TERM GOAL #2   Title Negotiate steps using a step over step sequence without use of rail to demo incr. strength and balance.  (12-31-14)   Time 4   Period Weeks   Status New   PT LONG TERM GOAL #3   Title Increase gait velocity to >/= 3.5 ft/sec without device for improved gait efficiency.  (12-31-14)   Baseline 2.79 ft/sec   Time  4   Period Weeks   Status New   PT LONG TERM GOAL #4   Title Independent in HEP for LLE strengthening.  (12-31-14)   Time 4   Period Weeks   Status New               Plan - 12/13/14 1145    Clinical Impression Statement Pt continues to fatigue easily with use of weights with PRE's but recovers quickly; L hip extension is increasing in strength with MMT grade today 4-/5   Pt will benefit from skilled therapeutic intervention in order to improve on the following deficits Abnormal gait;Decreased balance;Decreased strength   Rehab Potential Good   PT Frequency 2x / week   PT Duration 4 weeks   PT Treatment/Interventions ADLs/Self Care Home Management;Gait training;Stair training;Functional mobility training;Therapeutic activities;Therapeutic exercise;Balance training;Patient/family education;Neuromuscular re-education   PT Next Visit Plan cont PRE's for LLE strengthening   PT Home Exercise Plan see above   Consulted and Agree with Plan of Care Patient         Problem List There are no active problems to display for this patient.   Alda Lea, PT 12/13/2014, 11:48 AM  [facility] [address] [place], [place], [address] Phone: [phone]   Fax:  [phone]  Name: Sheri Blevins MRN: [id number] Date of Birth: 24-Mar-1940

## 2014-12-14 ENCOUNTER — Ambulatory Visit: Payer: BLUE CROSS/BLUE SHIELD | Admitting: Physical Therapy

## 2014-12-14 ENCOUNTER — Encounter: Payer: Self-pay | Admitting: Physical Therapy

## 2014-12-14 DIAGNOSIS — R269 Unspecified abnormalities of gait and mobility: Secondary | ICD-10-CM

## 2014-12-14 DIAGNOSIS — R2681 Unsteadiness on feet: Secondary | ICD-10-CM | POA: Diagnosis not present

## 2014-12-14 DIAGNOSIS — R29898 Other symptoms and signs involving the musculoskeletal system: Secondary | ICD-10-CM

## 2014-12-17 ENCOUNTER — Encounter: Payer: Self-pay | Admitting: Physical Therapy

## 2014-12-17 NOTE — Therapy (Signed)
[facility] [address] [place], [place], [address] Phone: [phone]   Fax:  [phone]  Physical Therapy Treatment  Patient Details  Name: Sheri Blevins MRN: [id number] Date of Birth: 25-Aug-1940 Referring Provider: Dr. Latanya Maudlin  Encounter Date: 12/14/2014      PT End of Session - 12/17/14 1603    Visit Number 4   Number of Visits 9   Date for PT Re-Evaluation 12/31/14   Authorization Type BCBS/Medicare    PT Start Time 0802   PT Stop Time 0849   PT Time Calculation (min) 47 min      History reviewed. No pertinent past medical history.  History reviewed. No pertinent past surgical history.  There were no vitals filed for this visit.  Visit Diagnosis:  Weakness of left leg  Abnormality of gait      Subjective Assessment - 12/17/14 1558    Subjective Pt states she feels that she is walking a little better   Pertinent History R THR August  2010; surgery on L foot for bunionectomy and hammer toe in 2011; osteopenia   Patient Stated Goals get L leg stronger and walk better   Currently in Pain? No/denies                         [facility] Adult PT Treatment/Exercise - 12/17/14 0001    Lumbar Exercises: Aerobic   Stationary Bike SciFit level 2.0 x 5"   Lumbar Exercises: Machines for Strengthening   Leg Press 40# bil. LE 30 reps; LLE only 20# 10 reps   Lumbar Exercises: Standing   Heel Raises 10 reps   Lumbar Exercises: Seated   Sit to Stand 10 reps  ball placed between knees to fascilitate adductors   Sit to Stand Limitations bil. hip extension weakness   Lumbar Exercises: Supine   Clam 10 reps  3# on RLE, then on LLE   Bent Knee Raise 10 reps  2# weight used   Bridge 10 reps   Straight Leg Raise 10 reps  2# weight used on each leg   Other Supine Lumbar Exercises Bridging with hip abdct/adduction and with marching x 5 reps each; bridging with LE extension x 5 reps each leg    Lumbar Exercises: Sidelying   Hip Abduction 10 reps  2# weight used   Lumbar Exercises: Prone   Other Prone Lumbar Exercises hip extension with knee flexed at 90 degrees   10 reps   Other Prone Lumbar Exercises hip extension with extended knee   Knee/Hip Exercises: Standing   Forward Step Up Both;10 reps;Step Height: 6"                     PT Long Term Goals - 12/06/14 0955    PT LONG TERM GOAL #1   Title Pt will report at least 25% improvement in strength and steadiness with ambulation.  (12-31-14)   Time 4   Period Weeks   Status New   PT LONG TERM GOAL #2   Title Negotiate steps using a step over step sequence without use of rail to demo incr. strength and balance.  (12-31-14)   Time 4   Period Weeks   Status New   PT LONG TERM GOAL #3   Title Increase gait velocity to >/= 3.5 ft/sec without device for improved gait efficiency.  (12-31-14)   Baseline 2.79 ft/sec   Time 4   Period Weeks  Status New   PT LONG TERM GOAL #4   Title Independent in HEP for LLE strengthening.  (12-31-14)   Time 4   Period Weeks   Status New               Plan - 12/17/14 1604    Clinical Impression Statement R hip extensors weaker than LLE today - pt stated she could tell a difference but didn't know why   Pt will benefit from skilled therapeutic intervention in order to improve on the following deficits Abnormal gait;Decreased balance;Decreased strength   Rehab Potential Good   PT Frequency 2x / week   PT Duration 4 weeks   PT Treatment/Interventions ADLs/Self Care Home Management;Gait training;Stair training;Functional mobility training;Therapeutic activities;Therapeutic exercise;Balance training;Patient/family education;Neuromuscular re-education   PT Next Visit Plan cont PRE's for LLE strengthening   PT Home Exercise Plan see above   Consulted and Agree with Plan of Care Patient        Problem List There are no active problems to display for this  patient.   Alda Lea, PT 12/17/2014, 4:06 PM  [facility] [address] [place], [place], [address] Phone: [phone]   Fax:  [phone]  Name: SHONTAE ROSILES MRN: [id number] Date of Birth: [birth year]

## 2014-12-18 ENCOUNTER — Ambulatory Visit: Payer: BLUE CROSS/BLUE SHIELD | Admitting: Physical Therapy

## 2014-12-18 ENCOUNTER — Encounter: Payer: Self-pay | Admitting: Physical Therapy

## 2014-12-18 DIAGNOSIS — R269 Unspecified abnormalities of gait and mobility: Secondary | ICD-10-CM | POA: Diagnosis not present

## 2014-12-18 DIAGNOSIS — R29898 Other symptoms and signs involving the musculoskeletal system: Secondary | ICD-10-CM

## 2014-12-18 DIAGNOSIS — R2681 Unsteadiness on feet: Secondary | ICD-10-CM | POA: Diagnosis not present

## 2014-12-20 ENCOUNTER — Encounter: Payer: Self-pay | Admitting: Physical Therapy

## 2014-12-21 ENCOUNTER — Ambulatory Visit: Payer: BLUE CROSS/BLUE SHIELD | Admitting: Physical Therapy

## 2014-12-21 ENCOUNTER — Encounter: Payer: Self-pay | Admitting: Physical Therapy

## 2014-12-21 DIAGNOSIS — R269 Unspecified abnormalities of gait and mobility: Secondary | ICD-10-CM

## 2014-12-21 DIAGNOSIS — R29898 Other symptoms and signs involving the musculoskeletal system: Secondary | ICD-10-CM

## 2014-12-21 DIAGNOSIS — R2681 Unsteadiness on feet: Secondary | ICD-10-CM

## 2014-12-21 NOTE — Therapy (Signed)
[facility] [address] [place], [place], [address] Phone: [phone]   Fax:  [phone]  Physical Therapy Treatment  Patient Details  Name: Sheri Blevins MRN: [id number] Date of Birth: 01-Apr-1940 Referring Provider: Dr. Latanya Maudlin  Encounter Date: 12/18/2014      PT End of Session - 12/21/14 1413    Visit Number 5   Number of Visits 9   Date for PT Re-Evaluation 12/31/14   Authorization Type BCBS/Medicare    PT Start Time 1450   PT Stop Time 1534   PT Time Calculation (min) 44 min      History reviewed. No pertinent past medical history.  History reviewed. No pertinent past surgical history.  There were no vitals filed for this visit.  Visit Diagnosis:  Weakness of left leg  Abnormality of gait      Subjective Assessment - 12/21/14 0922    Subjective Pt states that her legs feel a little weaker today than what they were feeling last week - doesn't know why   Pertinent History R THR August  2010; surgery on L foot for bunionectomy and hammer toe in 2011; osteopenia   Patient Stated Goals get L leg stronger and walk better   Currently in Pain? No/denies                         [facility] Adult PT Treatment/Exercise - 12/21/14 0001    Lumbar Exercises: Aerobic   Stationary Bike SciFit level 2.0 x 5"   Lumbar Exercises: Machines for Strengthening   Leg Press 40# bil. LE 30 reps; LLE only 20# 10 reps   Lumbar Exercises: Standing   Heel Raises 10 reps   Lumbar Exercises: Seated   Sit to Stand 10 reps   Lumbar Exercises: Supine   Clam 10 reps  3# on RLE, then on LLE   Bent Knee Raise 10 reps  2# weight used   Bridge 10 reps   Straight Leg Raise 10 reps  2# weight used on each leg   Other Supine Lumbar Exercises Bridging with hip abdct/adduction and with marching x 5 reps each; bridging with LE extension x 5 reps each leg   Lumbar Exercises: Sidelying   Hip Abduction 10 reps  2#  weight used   Lumbar Exercises: Prone   Straight Leg Raise 10 reps  2# weight used on LLE   Other Prone Lumbar Exercises hip extension with knee flexed at 90 degrees   10 reps   Other Prone Lumbar Exercises hip extension with extended knee   Knee/Hip Exercises: Seated   Sit to Sand 10 reps;without UE support  from mat - onto floor                     PT Long Term Goals - 12/06/14 0955    PT LONG TERM GOAL #1   Title Pt will report at least 25% improvement in strength and steadiness with ambulation.  (12-31-14)   Time 4   Period Weeks   Status New   PT LONG TERM GOAL #2   Title Negotiate steps using a step over step sequence without use of rail to demo incr. strength and balance.  (12-31-14)   Time 4   Period Weeks   Status New   PT LONG TERM GOAL #3   Title Increase gait velocity to >/= 3.5 ft/sec without device for improved gait efficiency.  (12-31-14)  Baseline 2.79 ft/sec   Time 4   Period Weeks   Status New   PT LONG TERM GOAL #4   Title Independent in HEP for LLE strengthening.  (12-31-14)   Time 4   Period Weeks   Status New               Plan - 12/21/14 1415    Clinical Impression Statement Pt. presents with some weakness in RLE - strength appears to fluctuate - RLE weakness has not been significantly noted until today's treatment   Pt will benefit from skilled therapeutic intervention in order to improve on the following deficits Abnormal gait;Decreased balance;Decreased strength   Rehab Potential Good   PT Frequency 2x / week   PT Duration 4 weeks   PT Treatment/Interventions ADLs/Self Care Home Management;Gait training;Stair training;Functional mobility training;Therapeutic activities;Therapeutic exercise;Balance training;Patient/family education;Neuromuscular re-education   PT Next Visit Plan cont PRE's for LLE strengthening   PT Home Exercise Plan see above   Consulted and Agree with Plan of Care Patient        Problem List There  are no active problems to display for this patient.   [id number], PT  12/21/2014, 2:24 PM  [facility] [address] [place], [place], [address] Phone: [phone]   Fax:  [phone]  Name: Sheri Blevins MRN: [id number] Date of Birth: [birth year]

## 2014-12-24 ENCOUNTER — Encounter: Payer: Self-pay | Admitting: Physical Therapy

## 2014-12-24 NOTE — Therapy (Signed)
[facility] [address] [place], [place], [address] Phone: [phone]   Fax:  [phone]  Physical Therapy Treatment  Patient Details  Name: Sheri Blevins MRN: [id number] Date of Birth: [birth year] Referring Provider: Dr. Latanya Maudlin  Encounter Date: 12/21/2014      PT End of Session - 12/24/14 2032    Visit Number 6   Number of Visits 9   Date for PT Re-Evaluation 12/31/14   Authorization Type BCBS/Medicare    PT Start Time 0803   PT Stop Time 0850   PT Time Calculation (min) 47 min      History reviewed. No pertinent past medical history.  History reviewed. No pertinent past surgical history.  There were no vitals filed for this visit.  Visit Diagnosis:  Weakness of left leg  Abnormality of gait  Unsteadiness on feet      Subjective Assessment - 12/24/14 2028    Subjective Pt states she feels stronger in her legs today - hasn't done exercises alot at home due to being busy   Pertinent History R THR August  2010; surgery on L foot for bunionectomy and hammer toe in 2011; osteopenia   Patient Stated Goals get L leg stronger and walk better   Currently in Pain? No/denies                         [facility] Adult PT Treatment/Exercise - 12/24/14 0001    Transfers   Transfers Sit to Stand   Sit to Stand With upper extremity assist;5: Supervision  from mat - 5 reps   Ambulation/Gait   Ambulation/Gait Yes   Ambulation/Gait Assistance 6: Modified independent (Device/Increase time)   Ambulation Distance (Feet) 250 Feet   Assistive device None   Gait Pattern Within Functional Limits;Trunk flexed;Decreased stride length;Decreased arm swing - left;Decreased arm swing - right   Ambulation Surface Level;Indoor   Lumbar Exercises: Stretches   Active Hamstring Stretch 1 rep;20 seconds  LLE   Lumbar Exercises: Aerobic   Stationary Bike SciFit level 2.0 x 5"   Lumbar Exercises: Machines for  Strengthening   Leg Press 40# bil. LE 30 reps; LLE only 20# 10 reps   Lumbar Exercises: Standing   Heel Raises 10 reps   Lumbar Exercises: Seated   Sit to Stand 10 reps   Sit to Stand Limitations bil. hip extension weakness   Lumbar Exercises: Supine   Clam 10 reps  5# on RLE, then on LLE   Bent Knee Raise 10 reps   Bridge 10 reps   Straight Leg Raise 10 reps   Other Supine Lumbar Exercises Bridging with hip abdct/adduction and with marching x 5 reps each; bridging with LE extension x 5 reps each leg                     PT Long Term Goals - 12/06/14 0955    PT LONG TERM GOAL #1   Title Pt will report at least 25% improvement in strength and steadiness with ambulation.  (12-31-14)   Time 4   Period Weeks   Status New   PT LONG TERM GOAL #2   Title Negotiate steps using a step over step sequence without use of rail to demo incr. strength and balance.  (12-31-14)   Time 4   Period Weeks   Status New   PT LONG TERM GOAL #3   Title Increase gait velocity to >/= 3.5  ft/sec without device for improved gait efficiency.  (12-31-14)   Baseline 2.79 ft/sec   Time 4   Period Weeks   Status New   PT LONG TERM GOAL #4   Title Independent in HEP for LLE strengthening.  (12-31-14)   Time 4   Period Weeks   Status New               Plan - 12/24/14 2033    Clinical Impression Statement Gait much improved with cues to increase trunk extension and to increase arm swing; noted incr. step length bil. LE's with cues to look ahead, and not down   Pt will benefit from skilled therapeutic intervention in order to improve on the following deficits Abnormal gait;Decreased balance;Decreased strength   Rehab Potential Good   PT Frequency 2x / week   PT Duration 4 weeks   PT Treatment/Interventions ADLs/Self Care Home Management;Gait training;Stair training;Functional mobility training;Therapeutic activities;Therapeutic exercise;Balance training;Patient/family  education;Neuromuscular re-education   PT Next Visit Plan cont PRE's for LLE strengthening   PT Home Exercise Plan see above   Consulted and Agree with Plan of Care Patient        Problem List There are no active problems to display for this patient.   Alda Lea, PT 12/24/2014, 8:39 PM  [facility] [address] [place], [place], [address] Phone: [phone]   Fax:  [phone]  Name: Sheri Blevins MRN: [id number] Date of Birth: 17-Apr-1940

## 2014-12-26 ENCOUNTER — Ambulatory Visit: Payer: BLUE CROSS/BLUE SHIELD | Admitting: Physical Therapy

## 2014-12-26 DIAGNOSIS — R269 Unspecified abnormalities of gait and mobility: Secondary | ICD-10-CM | POA: Diagnosis not present

## 2014-12-26 DIAGNOSIS — R29898 Other symptoms and signs involving the musculoskeletal system: Secondary | ICD-10-CM | POA: Diagnosis not present

## 2014-12-26 DIAGNOSIS — R2681 Unsteadiness on feet: Secondary | ICD-10-CM

## 2014-12-27 ENCOUNTER — Encounter: Payer: Self-pay | Admitting: Physical Therapy

## 2014-12-27 NOTE — Therapy (Signed)
[facility] [address] [place], [place], [address] Phone: [phone]   Fax:  [phone]  Physical Therapy Treatment  Patient Details  Name: Sheri Blevins MRN: [id number] Date of Birth: May 04, 1940 Referring Provider: Dr. Latanya Maudlin  Encounter Date: 12/26/2014      PT End of Session - 12/27/14 1407    Visit Number 7   Number of Visits 9   Date for PT Re-Evaluation 12/31/14   Authorization Type BCBS/Medicare    PT Start Time 0801   PT Stop Time 0850   PT Time Calculation (min) 49 min      History reviewed. No pertinent past medical history.  History reviewed. No pertinent past surgical history.  There were no vitals filed for this visit.  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 No/denies                         [facility] Adult PT Treatment/Exercise - 12/27/14 0001    Transfers   Transfers Sit to Stand   Sit to Stand 5: Supervision;Without upper extremity assist  ball between knees to increase adductor strength   Ambulation/Gait   Ambulation/Gait Yes   Ambulation/Gait Assistance 5: Supervision   Ambulation/Gait Assistance Details cues for posture   Assistive device None   Gait Pattern Within Functional Limits;Trunk flexed;Decreased stride length;Decreased arm swing - left;Decreased arm swing - right   Ambulation Surface Level;Indoor   Lumbar Exercises: Stretches   Passive Hamstring Stretch 1 rep;20 seconds  RLE and LLE; with contract/relax   Lumbar Exercises: Aerobic   Stationary Bike SciFit level 2.0 x 5"   Lumbar Exercises: Machines for  Strengthening   Leg Press 40# bil. LE 30 reps; LLE only 20# 10 reps   Lumbar Exercises: Standing   Heel Raises 10 reps   Lumbar Exercises: Seated   Sit to Stand 10 reps   Lumbar Exercises: Supine   Clam 10 reps  5# on RLE, then on LLE   Bridge 10 reps   Straight Leg Raise 10 reps   Lumbar Exercises: Sidelying   Hip Abduction 10 reps  2# weight used   Other Sidelying Lumbar Exercises Pilates ex - hip abduction with clockwise and counterclockwise circles x 5 reps each     Bridging with marching x 5 reps; bridging with alternate LE extension x 5 reps each and bridging with hip abduction/adduction x 5 reps each                PT Long Term Goals - 12/06/14 0955    PT LONG TERM GOAL #1   Title Pt will report at least 25% improvement in strength and steadiness with ambulation.  (12-31-14)   Time 4   Period Weeks   Status New   PT LONG TERM GOAL #2   Title Negotiate steps using a step over step sequence without use of rail to demo incr. strength and balance.  (12-31-14)   Time 4   Period Weeks   Status New   PT LONG TERM GOAL #3  Title Increase gait velocity to >/= 3.5 ft/sec without device for improved gait efficiency.  (12-31-14)   Baseline 2.79 ft/sec   Time 4   Period Weeks   Status New   PT LONG TERM GOAL #4   Title Independent in HEP for LLE strengthening.  (12-31-14)   Time 4   Period Weeks   Status New               Plan - 12/27/14 1408    Clinical Impression Statement Pt able to decr. gait deviaitons with cues; strength in bil. LE's is increasing but pt continues to have significant genu valgus   Pt will benefit from skilled therapeutic intervention in order to improve on the following deficits Abnormal gait;Decreased balance;Decreased strength   Rehab Potential Good   PT Frequency 2x / week   PT Duration 4 weeks   PT Treatment/Interventions ADLs/Self Care Home Management;Gait training;Stair training;Functional mobility training;Therapeutic  activities;Therapeutic exercise;Balance training;Patient/family education;Neuromuscular re-education   PT Next Visit Plan check LTG's and renew   PT Home Exercise Plan see above   Consulted and Agree with Plan of Care Patient        Problem List There are no active problems to display for this patient.   [id number], PT 12/27/2014, 2:10 PM  [facility] [address] [place], [place], [address] Phone: [phone]   Fax:  [phone]  Name: Sheri Blevins MRN: [id number] Date of Birth: 10-13-1940

## 2014-12-28 ENCOUNTER — Encounter: Payer: Self-pay | Admitting: Physical Therapy

## 2014-12-28 ENCOUNTER — Ambulatory Visit: Payer: BLUE CROSS/BLUE SHIELD | Admitting: Physical Therapy

## 2014-12-28 DIAGNOSIS — R29898 Other symptoms and signs involving the musculoskeletal system: Secondary | ICD-10-CM | POA: Diagnosis not present

## 2014-12-28 DIAGNOSIS — R2681 Unsteadiness on feet: Secondary | ICD-10-CM

## 2014-12-28 DIAGNOSIS — R269 Unspecified abnormalities of gait and mobility: Secondary | ICD-10-CM

## 2014-12-28 NOTE — Therapy (Signed)
[facility] [address] [place], [place], [address] Phone: [phone]   Fax:  [phone]  Physical Therapy Treatment  Patient Details  Name: Sheri Blevins MRN: [id number] Date of Birth: May 11, 1940 Referring Provider: Dr. Latanya Maudlin  Encounter Date: 12/28/2014      PT End of Session - 12/28/14 1601    Visit Number 8   Number of Visits 9   Date for PT Re-Evaluation 12/31/14   Authorization Type BCBS/Medicare    PT Start Time 0802   PT Stop Time 0849   PT Time Calculation (min) 47 min      History reviewed. No pertinent past medical history.  History reviewed. No pertinent past surgical history.  There were no vitals filed for this visit.  Visit Diagnosis:  Weakness of left leg - Plan: PT plan of care cert/re-cert  Abnormality of gait - Plan: PT plan of care cert/re-cert  Unsteadiness on feet - Plan: PT plan of care cert/re-cert      Subjective Assessment - 12/28/14 1544    Subjective Pt. 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 No/denies  pt reported some discomfort occasionally during exercises in lateral  proximal lower legs - on R and L   Multiple Pain Sites No      Manual Muscle test grades;  L hip flexion = 3+/5;  R hip flexion 4/5                                               L hip abduction = 3+/5   R = 4-/5                                               L hip extension = 4/5   R = 3+/5                                                 L knee extension = 4/5;  L knee flexion = 4-/5;  R knee flexion 4/5                   [facility] Adult PT Treatment/Exercise - 12/28/14 0813    Ambulation/Gait   Ambulation/Gait Yes   Ambulation/Gait Assistance  5: Supervision   Gait velocity 3.29  9.97 secs with no device   Stairs Yes   Stair Management Technique No rails;One rail Left;Other (comment);Step to pattern  no rail ascending; 1 rail needed with descending   Number of Stairs 4   Height of Stairs 6   Timed Up and Go Test   TUG Normal TUG   Normal TUG (seconds) 11.18  no device   High Level Balance   High Level Balance Comments L SLS = 2.03 secs;  R SLS= 7.75   Lumbar Exercises: Aerobic  Stationary Bike SciFit level 1.5" x 6" with UE's and LE's                     PT Long Term Goals - 12/28/14 1605    PT LONG TERM GOAL #5   Title Increase gait velocity to >/= 3.6 ft/sec without device.  (01-27-15)   Time 4   Period Weeks   Status New   Additional Long Term Goals   Additional Long Term Goals Yes   PT LONG TERM GOAL #6   Title Negotiate steps using a step over step sequence without rail for ascension and step by step sequence without rail for descension.  (01-27-15)   Time 4   Period Weeks   Status New   PT LONG TERM GOAL #7   Title Increase balance so pt able to perform SLS on LLE for >/= 3.0 secs to demo improved balance.  (01-27-15)   Time 4   Period Weeks   Status New   PT LONG TERM GOAL #8   Title Perform sit to stand from 18" block to simulate standing from church pew - without UE support - to demo increased LE strength.  (01-27-15)   Time 4   Period Weeks   Status New               Plan - 12/28/14 1602    Clinical Impression Statement Pt met LTG's #1 and 4;  #2 and 3 not met due to LE weakness and balance deficits; pt continues to have bil. hip musculature weakness with gait deviations present, including genu valgus   Pt will benefit from skilled therapeutic intervention in order to improve on the following deficits Abnormal gait;Decreased balance;Decreased strength   Rehab Potential Good   PT Frequency 2x / week   PT Duration 4 weeks   PT Treatment/Interventions ADLs/Self Care Home  Management;Gait training;Stair training;Functional mobility training;Therapeutic activities;Therapeutic exercise;Balance training;Patient/family education;Neuromuscular re-education   PT Next Visit Plan continue strengthening - inquire about results from MD visit scheduled for 12-29-14   PT Home Exercise Plan see above   Consulted and Agree with Plan of Care Patient        Problem List There are no active problems to display for this patient.   Alda Lea, PT 12/28/2014, 4:44 PM  [facility] [address] [place], [place], [address] Phone: [phone]   Fax:  [phone]  Name: Sheri Blevins MRN: [id number] Date of Birth: 08-26-1940

## 2014-12-29 DIAGNOSIS — M6281 Muscle weakness (generalized): Secondary | ICD-10-CM | POA: Diagnosis not present

## 2014-12-29 DIAGNOSIS — M4316 Spondylolisthesis, lumbar region: Secondary | ICD-10-CM | POA: Diagnosis not present

## 2015-01-09 ENCOUNTER — Ambulatory Visit: Payer: Medicare Other | Admitting: Physical Therapy

## 2015-01-11 ENCOUNTER — Ambulatory Visit: Payer: BLUE CROSS/BLUE SHIELD | Attending: Orthopedic Surgery | Admitting: Physical Therapy

## 2015-01-11 DIAGNOSIS — R269 Unspecified abnormalities of gait and mobility: Secondary | ICD-10-CM | POA: Insufficient documentation

## 2015-01-11 DIAGNOSIS — R29898 Other symptoms and signs involving the musculoskeletal system: Secondary | ICD-10-CM | POA: Diagnosis not present

## 2015-01-11 DIAGNOSIS — R2681 Unsteadiness on feet: Secondary | ICD-10-CM | POA: Insufficient documentation

## 2015-01-14 ENCOUNTER — Encounter: Payer: Self-pay | Admitting: Physical Therapy

## 2015-01-14 NOTE — Therapy (Signed)
[facility] [address] [place], [place], [address] Phone: [phone]   Fax:  [phone]  Physical Therapy Treatment  Patient Details  Name: Sheri Blevins MRN: [id number] Date of Birth: October 17, 1940 Referring Provider: Dr. Latanya Maudlin  Encounter Date: 01/11/2015      PT End of Session - 01/14/15 1856    Visit Number 9   Number of Visits 17   Date for PT Re-Evaluation 01/30/15   Authorization Type BCBS/Medicare    PT Start Time 0800   PT Stop Time 0845   PT Time Calculation (min) 45 min      History reviewed. No pertinent past medical history.  History reviewed. No pertinent past surgical history.  There were no vitals filed for this visit.  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 No/denies                         [facility] Adult PT Treatment/Exercise - 01/14/15 0001    Transfers   Transfers Sit to Stand   Sit to Stand 5: Supervision;Without upper extremity assist  ball between knees to increase adductor strength   Ambulation/Gait   Stairs Yes   Stairs Assistance 5: Supervision   Stair Management Technique No rails;One rail Left;Other (comment);Step to pattern  no rail ascending; 1 rail needed with descending   Number of Stairs 4   Height of Stairs 6   Lumbar Exercises: Stretches   Active Hamstring Stretch 1 rep;20 seconds  LLE   Passive Hamstring Stretch 1 rep;20 seconds  RLE and LLE; with contract/relax   Lumbar Exercises: Aerobic   Stationary Bike SciFit level 1.5" x 6" with UE's and LE's    Lumbar Exercises: Standing   Heel Raises 10 reps   Lumbar Exercises: Seated   Sit to Stand 10 reps   Sit to Stand Limitations bil. hip extension weakness   Lumbar Exercises: Supine   Clam 10 reps  5# on RLE, then on LLE   Bent Knee Raise 10 reps   Bridge 10 reps   Straight Leg Raise 10 reps   Other Supine Lumbar Exercises Bridging with hip abdct/adduction and with marching x 5 reps each; bridging with LE extension x 5 reps each leg   Lumbar Exercises: Sidelying   Hip Abduction 10 reps  2# weight used   Hip Abduction Limitations weakness   Other Sidelying Lumbar Exercises Pilates ex - hip abduction with clockwise and counterclockwise circles x 5 reps each   Lumbar Exercises: Prone   Other Prone Lumbar Exercises hip extension with knee flexed at 90 degrees   10 reps   Other Prone Lumbar Exercises hip extension with extended knee                     PT Long Term Goals - 12/28/14 1605    PT LONG TERM GOAL #5   Title Increase gait velocity to >/= 3.6 ft/sec without device.  (01-27-15)   Time 4  Period Weeks   Status New   Additional Long Term Goals   Additional Long Term Goals Yes   PT LONG TERM GOAL #6   Title Negotiate steps using a step over step sequence without rail for ascension and step by step sequence without rail for descension.  (01-27-15)   Time 4   Period Weeks   Status New   PT LONG TERM GOAL #7   Title Increase balance so pt able to perform SLS on LLE for >/= 3.0 secs to demo improved balance.  (01-27-15)   Time 4   Period Weeks   Status New   PT LONG TERM GOAL #8   Title Perform sit to stand from 18" block to simulate standing from church pew - without UE support - to demo increased LE strength.  (01-27-15)   Time 4   Period Weeks   Status New               Plan - 01/14/15 1856    Clinical Impression Statement Pt cont to exhibit bil. hip musc. weakness with LLE weaker than RLE   Pt will benefit from skilled therapeutic  intervention in order to improve on the following deficits Abnormal gait;Decreased balance;Decreased strength   Rehab Potential Good   PT Frequency 2x / week   PT Duration 4 weeks   PT Treatment/Interventions ADLs/Self Care Home Management;Gait training;Stair training;Functional mobility training;Therapeutic activities;Therapeutic exercise;Balance training;Patient/family education;Neuromuscular re-education   PT Next Visit Plan cont ther ex   PT Home Exercise Plan see above   Consulted and Agree with Plan of Care Patient        Problem List There are no active problems to display for this patient.   Alda Lea, PT 01/14/2015, 7:00 PM  [facility] [address] [place], [place], [address] Phone: [phone]   Fax:  [phone]  Name: OLINE KNAPKE MRN: [id number] Date of Birth: May 07, 1940

## 2015-01-16 ENCOUNTER — Ambulatory Visit: Payer: BLUE CROSS/BLUE SHIELD | Admitting: Physical Therapy

## 2015-01-16 DIAGNOSIS — R29898 Other symptoms and signs involving the musculoskeletal system: Secondary | ICD-10-CM | POA: Diagnosis not present

## 2015-01-16 DIAGNOSIS — R269 Unspecified abnormalities of gait and mobility: Secondary | ICD-10-CM | POA: Diagnosis not present

## 2015-01-16 DIAGNOSIS — R2681 Unsteadiness on feet: Secondary | ICD-10-CM | POA: Diagnosis not present

## 2015-01-17 ENCOUNTER — Encounter: Payer: Self-pay | Admitting: Physical Therapy

## 2015-01-17 NOTE — Therapy (Signed)
[facility] [address] [place], [place], [address] Phone: [phone]   Fax:  [phone]  Physical Therapy Treatment  Patient Details  Name: Sheri Blevins MRN: [id number] Date of Birth: Aug 28, 1940 Referring Provider: Dr. Latanya Maudlin  Encounter Date: 01/16/2015      PT End of Session - 01/17/15 2127    Visit Number 10   Number of Visits 17   Date for PT Re-Evaluation 01/30/15   Authorization Type BCBS/Medicare    PT Start Time 0800   PT Stop Time 0847   PT Time Calculation (min) 47 min      History reviewed. No pertinent past medical history.  History reviewed. No pertinent past surgical history.  There were no vitals filed for this visit.  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 No/denies                         [facility] Adult PT Treatment/Exercise - 01/17/15 0001    Ambulation/Gait   Ambulation/Gait Yes   Ambulation/Gait Assistance 5: Supervision   Ambulation Distance (Feet) 200 Feet   Assistive device None   Gait Pattern Within Functional Limits   Ambulation Surface Level;Indoor   Stairs Yes   Stairs Assistance 5: Supervision   Stair Management Technique No rails;One rail Left;Other (comment);Step to pattern  no rail ascending; 1 rail needed with descending   Number of Stairs 4   Height of Stairs 6   Lumbar Exercises: Aerobic   Stationary Bike SciFit level 1.5" x 6" with UE's and LE's   Lumbar Exercises: Machines for Strengthening   Leg Press 40# bil. LE 30 reps; LLE only 20# 10 reps   Lumbar Exercises: Standing   Heel Raises 10 reps   Lumbar  Exercises: Supine   Clam 10 reps  5# on RLE, then on LLE   Bent Knee Raise 10 reps   Bridge 10 reps   Straight Leg Raise 10 reps   Other Supine Lumbar Exercises Bridging with hip abdct/adduction and with marching x 5 reps each; bridging with LE extension x 5 reps each leg   Lumbar Exercises: Sidelying   Hip Abduction 10 reps  2# weight used   Lumbar Exercises: Prone   Straight Leg Raise 10 reps  2# weight used on LLE   Other Prone Lumbar Exercises hip extension with knee flexed at 90 degrees   10 reps   Other Prone Lumbar Exercises hip extension with extended knee   Knee/Hip Exercises: Standing   Forward Step Up Both;10 reps;Step Height: 6"                     PT Long Term Goals - 01/17/15 2129    PT LONG TERM GOAL #1   Title Pt will report at least 25% improvement in strength and steadiness with ambulation.  (12-31-14)   Baseline met 12-28-14   Status Achieved   PT LONG TERM GOAL #2   Title Negotiate steps using a step over step sequence without use of rail to demo incr.  strength and balance.  (12-31-14)   Baseline not met due to decr. balance - 12-28-14   Status Not Met   PT LONG TERM GOAL #3   Title Increase gait velocity to >/= 3.5 ft/sec without device for improved gait efficiency.  (12-31-14)   Baseline 3.29 ft/sec   Status Not Met   PT LONG TERM GOAL #4   Title Independent in HEP for LLE strengthening.  (12-31-14)   Status Achieved   PT LONG TERM GOAL #5   Title Increase gait velocity to >/= 3.6 ft/sec without device.  (01-27-15)   Status New   PT LONG TERM GOAL #6   Title Negotiate steps using a step over step sequence without rail for ascension and step by step sequence without rail for descension.  (01-27-15)   Status New   PT LONG TERM GOAL #7   Title Increase balance so pt able to perform SLS on LLE for >/= 3.0 secs to demo improved balance.  (01-27-15)   Status New               Plan - 01/17/15 04-08-2128    Clinical Impression Statement Pt  progressing towards goals - pt does continue to exhbit bil. hip musc. weakness   Pt will benefit from skilled therapeutic intervention in order to improve on the following deficits Abnormal gait;Decreased balance;Decreased strength   Rehab Potential Good   PT Frequency 2x / week   PT Duration 4 weeks   PT Treatment/Interventions ADLs/Self Care Home Management;Gait training;Stair training;Functional mobility training;Therapeutic activities;Therapeutic exercise;Balance training;Patient/family education;Neuromuscular re-education   PT Next Visit Plan cont ther ex   PT Home Exercise Plan see above   Consulted and Agree with Plan of Care Patient          G-Codes - 02-05-15 04-08-2129    Functional Assessment Tool Used gait velocity 3.29 ft/sec; pt amb. without device; TUG score 11.75 secs   Functional Limitation Mobility: Walking and moving around   Mobility: Walking and Moving Around Current Status (M1470) At least 20 percent but less than 40 percent impaired, limited or restricted   Mobility: Walking and Moving Around Goal Status [phone]) At least 1 percent but less than 20 percent impaired, limited or restricted      Problem List There are no active problems to display for this patient.   Physical Therapy Progress Note  Dates of Reporting Period: 11-30-14 To 02-05-15  Objective Reports of Subjective Statement:  Pt has met LTG's #1 and #4; #2 and 3 not met due to LE weakness and decr. Standing balance; pt continues to exhibit genu valgus  Objective Measurements: TUG score 11.75 secs;  Gait velocity 3.29 ft/sec  Goal Update: see above for progress towards goals  Plan: Cont. Gait training, therex for strengthening and balance retraining  Reason Skilled Services are Required: bil. LE weakness, decr. Balance (esp. Decr. High level balance skills)   Alexianna Nachreiner, [place], PT 01/17/2015, 9:37 PM  [facility] [address] [place], [place], [address] Phone: [phone]   Fax:  [phone]  Name: CASSITY CHRISTIAN MRN: [id number] Date of Birth: Aug 26, 1940

## 2015-01-18 ENCOUNTER — Ambulatory Visit: Payer: BLUE CROSS/BLUE SHIELD | Admitting: Physical Therapy

## 2015-01-18 ENCOUNTER — Encounter: Payer: Self-pay | Admitting: Physical Therapy

## 2015-01-18 DIAGNOSIS — R269 Unspecified abnormalities of gait and mobility: Secondary | ICD-10-CM | POA: Diagnosis not present

## 2015-01-18 DIAGNOSIS — R29898 Other symptoms and signs involving the musculoskeletal system: Secondary | ICD-10-CM

## 2015-01-18 DIAGNOSIS — R2681 Unsteadiness on feet: Secondary | ICD-10-CM | POA: Diagnosis not present

## 2015-01-21 ENCOUNTER — Encounter: Payer: Self-pay | Admitting: Physical Therapy

## 2015-01-21 NOTE — Therapy (Signed)
[facility] [address] [place], [place], [address] Phone: [phone]   Fax:  [phone]  Physical Therapy Treatment  Patient Details  Name: Sheri Blevins MRN: [id number] Date of Birth: February 07, 1941 Referring Provider: Dr. Latanya Maudlin  Encounter Date: 01/18/2015      PT End of Session - 01/21/15 2008    Visit Number 11   Number of Visits 17   Date for PT Re-Evaluation 01/30/15   Authorization Type BCBS/Medicare    PT Start Time 0800   PT Stop Time 0846   PT Time Calculation (min) 46 min      History reviewed. No pertinent past medical history.  History reviewed. No pertinent past surgical history.  There were no vitals filed for this visit.  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 No/denies                         [facility] Adult PT Treatment/Exercise - 01/21/15 0001    Transfers   Transfers Sit to Stand   Sit to Stand 5: Supervision  on blue foam   Ambulation/Gait   Ambulation/Gait Yes   Ambulation/Gait Assistance 5: Supervision   Ambulation/Gait Assistance Details noodle placed behind back to fascilitate upright posture   Ambulation Distance (Feet) 240 Feet   Assistive device None   Gait Pattern Within Functional Limits   Ambulation Surface Level;Indoor   Stairs Yes   Stairs Assistance 5: Supervision   Stair Management Technique No rails;One rail Left;Other (comment);Step to pattern  no rail ascending; 1 rail needed with descending   Number of Stairs 4   Height of Stairs 6   Lumbar Exercises: Stretches   Active Hamstring  Stretch 1 rep;20 seconds  LLE   Lumbar Exercises: Machines for Strengthening   Leg Press 50# bil. LE 30 reps; LLE only 20# 10 reps   Lumbar Exercises: Standing   Heel Raises 10 reps   Lumbar Exercises: Seated   Sit to Stand Limitations bil. hip extension weakness   Lumbar Exercises: Supine   Clam 10 reps  5# on RLE, then on LLE   Bent Knee Raise 10 reps   Bridge 10 reps   Straight Leg Raise 10 reps   Other Supine Lumbar Exercises Bridging with hip abdct/adduction and with marching x 5 reps each; bridging with LE extension x 5 reps each leg   Lumbar Exercises: Sidelying   Hip Abduction 10 reps   Other Sidelying Lumbar Exercises Pilates ex - hip abduction with clockwise and counterclockwise circles x 5 reps each   Lumbar Exercises: Prone   Other Prone Lumbar Exercises hip extension with knee flexed at 90 degrees   10 reps   Other Prone Lumbar Exercises hip extension with extended knee                     PT Long Term Goals - 01/17/15 2129    PT LONG TERM GOAL #1   Title Pt will report at least 25% improvement in strength and steadiness with ambulation.  (  12-31-14)   Baseline met 12-28-14   Status Achieved   PT LONG TERM GOAL #2   Title Negotiate steps using a step over step sequence without use of rail to demo incr. strength and balance.  (12-31-14)   Baseline not met due to decr. balance - 12-28-14   Status Not Met   PT LONG TERM GOAL #3   Title Increase gait velocity to >/= 3.5 ft/sec without device for improved gait efficiency.  (12-31-14)   Baseline 3.29 ft/sec   Status Not Met   PT LONG TERM GOAL #4   Title Independent in HEP for LLE strengthening.  (12-31-14)   Status Achieved   PT LONG TERM GOAL #5   Title Increase gait velocity to >/= 3.6 ft/sec without device.  (01-27-15)   Status New   PT LONG TERM GOAL #6   Title Negotiate steps using a step over step sequence without rail for ascension and step by step sequence without rail for descension.   (01-27-15)   Status New   PT LONG TERM GOAL #7   Title Increase balance so pt able to perform SLS on LLE for >/= 3.0 secs to demo improved balance.  (01-27-15)   Status New               Plan - 01/21/15 2009    Clinical Impression Statement Pt progressing well towards goals - able to perform PRE's with more weight, demonstrating incr. strength   Pt will benefit from skilled therapeutic intervention in order to improve on the following deficits Abnormal gait;Decreased balance;Decreased strength   Rehab Potential Good   PT Frequency 2x / week   PT Duration 4 weeks   PT Treatment/Interventions ADLs/Self Care Home Management;Gait training;Stair training;Functional mobility training;Therapeutic activities;Therapeutic exercise;Balance training;Patient/family education;Neuromuscular re-education   PT Next Visit Plan cont ther ex   PT Home Exercise Plan see above   Consulted and Agree with Plan of Care Patient        Problem List There are no active problems to display for this patient.   Alda Lea, PT 01/21/2015, [facility] [address] [place], [place], [address] Phone: [phone]   Fax:  [phone]  Name: Sheri Blevins MRN: [id number] Date of Birth: May 30, 1940

## 2015-01-30 ENCOUNTER — Ambulatory Visit: Payer: BLUE CROSS/BLUE SHIELD | Admitting: Physical Therapy

## 2015-01-30 DIAGNOSIS — R29898 Other symptoms and signs involving the musculoskeletal system: Secondary | ICD-10-CM | POA: Diagnosis not present

## 2015-01-30 DIAGNOSIS — R269 Unspecified abnormalities of gait and mobility: Secondary | ICD-10-CM | POA: Diagnosis not present

## 2015-01-30 DIAGNOSIS — R2681 Unsteadiness on feet: Secondary | ICD-10-CM | POA: Diagnosis not present

## 2015-01-30 NOTE — Therapy (Signed)
[facility] [address] [place], [place], [address] Phone: [phone]   Fax:  [phone]  Physical Therapy Treatment  Patient Details  Name: Sheri Blevins MRN: [id number] Date of Birth: Sep 01, 1940 Referring Provider: Dr. Latanya Maudlin  Encounter Date: 01/30/2015      PT End of Session - 02/05/15 1815    Visit Number 12   Number of Visits 17   Date for PT Re-Evaluation 02/06/15   Authorization Type BCBS/Medicare    PT Start Time 0801   PT Stop Time 0848   PT Time Calculation (min) 47 min      History reviewed. No pertinent past medical history.  History reviewed. No pertinent past surgical history.  There were no vitals filed for this visit.  Visit Diagnosis:  Weakness of left leg  Abnormality of gait                       [facility] Adult PT Treatment/Exercise - 02/05/15 0001    Ambulation/Gait   Ambulation/Gait Yes   Ambulation/Gait Assistance 5: Supervision   Ambulation/Gait Assistance Details noodle behind back to fascilitate upright posture   Ambulation Distance (Feet) 240 Feet   Assistive device None   Gait Pattern Within Functional Limits   Ambulation Surface Level;Indoor   Stairs Yes   Stairs Assistance 5: Supervision   Stair Management Technique No rails   Number of Stairs 4   Height of Stairs 6   Lumbar Exercises: Stretches   Active Hamstring Stretch 1 rep;20 seconds  LLE   Passive Hamstring Stretch 1 rep;20 seconds  RLE and LLE; with contract/relax   Lumbar Exercises: Aerobic   Stationary Bike SciFit level 1.5" x 6" with UE's and LE's   Lumbar Exercises: Machines for Strengthening   Leg Press 50# bil. LE 30 reps; LLE only 20# 10 reps   Lumbar Exercises: Standing   Heel Raises 10 reps   Lumbar Exercises: Supine   Clam 10 reps  5# on RLE, then on LLE   Bent Knee Raise 10 reps   Bridge 10 reps   Straight Leg Raise 10 reps   Other Supine Lumbar Exercises Bridging  with hip abdct/adduction and with marching x 5 reps each; bridging with LE extension x 5 reps each leg   Lumbar Exercises: Sidelying   Hip Abduction 10 reps   Hip Abduction Limitations weakness   Lumbar Exercises: Prone   Straight Leg Raise 10 reps  2# weight used on LLE   Other Prone Lumbar Exercises hip extension with knee flexed at 90 degrees   10 reps   Other Prone Lumbar Exercises hip extension with extended knee                     PT Long Term Goals - 01/17/15 2129    PT LONG TERM GOAL #1   Title Pt will report at least 25% improvement in strength and steadiness with ambulation.  (12-31-14)   Baseline met 12-28-14   Status Achieved   PT LONG TERM GOAL #2   Title Negotiate steps using a step over step sequence without use of rail to demo incr. strength and balance.  (12-31-14)   Baseline not met due to decr. balance - 12-28-14   Status Not Met   PT LONG TERM GOAL #3   Title Increase gait velocity to >/= 3.5 ft/sec without device for improved gait efficiency.  (12-31-14)   Baseline 3.29 ft/sec  Status Not Met   PT LONG TERM GOAL #4   Title Independent in HEP for LLE strengthening.  (12-31-14)   Status Achieved   PT LONG TERM GOAL #5   Title Increase gait velocity to >/= 3.6 ft/sec without device.  (01-27-15)   Status New   PT LONG TERM GOAL #6   Title Negotiate steps using a step over step sequence without rail for ascension and step by step sequence without rail for descension.  (01-27-15)   Status New   PT LONG TERM GOAL #7   Title Increase balance so pt able to perform SLS on LLE for >/= 3.0 secs to demo improved balance.  (01-27-15)   Status New               Plan - 02/05/15 1815    Clinical Impression Statement Pt cont to improve with balance and gait - progressing well towards goals   Pt will benefit from skilled therapeutic intervention in order to improve on the following deficits Abnormal gait;Decreased balance;Decreased strength   Rehab  Potential Good   PT Frequency 2x / week   PT Duration 4 weeks   PT Treatment/Interventions ADLs/Self Care Home Management;Gait training;Stair training;Functional mobility training;Therapeutic activities;Therapeutic exercise;Balance training;Patient/family education;Neuromuscular re-education   PT Next Visit Plan cont ther ex   PT Home Exercise Plan see above   Consulted and Agree with Plan of Care Patient        Problem List There are no active problems to display for this patient.   Alda Lea, PT 02/05/2015, 6:21 PM  [facility] [address] [place], [place], [address] Phone: [phone]   Fax:  [phone]  Name: LIEL RUDDEN MRN: [id number] Date of Birth: 30-Aug-1940

## 2015-02-01 ENCOUNTER — Encounter: Payer: Self-pay | Admitting: Physical Therapy

## 2015-02-01 ENCOUNTER — Ambulatory Visit: Payer: Medicare Other | Admitting: Physical Therapy

## 2015-02-07 DIAGNOSIS — Z85828 Personal history of other malignant neoplasm of skin: Secondary | ICD-10-CM | POA: Diagnosis not present

## 2015-02-07 DIAGNOSIS — B029 Zoster without complications: Secondary | ICD-10-CM | POA: Diagnosis not present

## 2015-02-08 ENCOUNTER — Ambulatory Visit: Payer: BLUE CROSS/BLUE SHIELD | Admitting: Physical Therapy

## 2015-02-08 DIAGNOSIS — R269 Unspecified abnormalities of gait and mobility: Secondary | ICD-10-CM

## 2015-02-08 DIAGNOSIS — R2681 Unsteadiness on feet: Secondary | ICD-10-CM | POA: Diagnosis not present

## 2015-02-08 DIAGNOSIS — R29898 Other symptoms and signs involving the musculoskeletal system: Secondary | ICD-10-CM | POA: Diagnosis not present

## 2015-02-12 ENCOUNTER — Encounter: Payer: Self-pay | Admitting: Physical Therapy

## 2015-02-12 NOTE — Therapy (Signed)
[facility] [address] [place], [place], [address] Phone: [phone]   Fax:  [phone]  Physical Therapy Treatment  Patient Details  Name: Sheri Blevins MRN: [id number] Date of Birth: [birth year] Referring Provider: Dr. Latanya Maudlin  Encounter Date: 02/08/2015      PT End of Session - 02/12/15 1953    Visit Number 13   Number of Visits 17   Date for PT Re-Evaluation 02/06/15   Authorization Type BCBS/Medicare    PT Start Time 0800   PT Stop Time 0846   PT Time Calculation (min) 46 min      History reviewed. No pertinent past medical history.  History reviewed. No pertinent past surgical history.  There were no vitals filed for this visit.  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 No/denies                         [facility] Adult PT Treatment/Exercise - 02/12/15 0001    Ambulation/Gait   Ambulation/Gait Yes   Ambulation/Gait Assistance 5: Supervision   Ambulation/Gait Assistance Details noodle behind back to fascilitate upright posture   Ambulation Distance (Feet) 240 Feet   Assistive device None   Gait Pattern Within Functional Limits   Ambulation Surface Level;Indoor   Stairs Yes   Stairs Assistance 5: Supervision   Stair Management Technique One rail Left   Number of Stairs 4   Height of Stairs 6   Lumbar Exercises: Aerobic   Stationary Bike SciFit level 1.5" x 6" with UE's and LE's   Lumbar Exercises: Machines for Strengthening   Leg Press 50# bil. LE 30 reps; LLE only 20# 10 reps   Lumbar  Exercises: Standing   Heel Raises 10 reps   Lumbar Exercises: Supine   Clam 10 reps  5# on RLE, then on LLE   Bent Knee Raise 10 reps  3# weight used on each leg   Bridge 10 reps   Straight Leg Raise 10 reps  3# weight on RLE and on LLE   Other Supine Lumbar Exercises Bridging with hip abdct/adduction and with marching x 5 reps each; bridging with LE extension x 5 reps each leg   Lumbar Exercises: Sidelying   Hip Abduction 10 reps   Lumbar Exercises: Prone   Other Prone Lumbar Exercises hip extension with knee flexed at 90 degrees   10 reps                     PT Long Term Goals - 01/17/15 2129    PT LONG TERM GOAL #1   Title Pt will report at least 25% improvement in strength and steadiness with ambulation.  (12-31-14)   Baseline met 12-28-14   Status Achieved   PT LONG TERM GOAL #2   Title Negotiate steps using a step over step sequence without use of rail to demo incr. strength and balance.  (12-31-14)   Baseline not met due to decr. balance - 12-28-14   Status Not  Met   PT LONG TERM GOAL #3   Title Increase gait velocity to >/= 3.5 ft/sec without device for improved gait efficiency.  (12-31-14)   Baseline 3.29 ft/sec   Status Not Met   PT LONG TERM GOAL #4   Title Independent in HEP for LLE strengthening.  (12-31-14)   Status Achieved   PT LONG TERM GOAL #5   Title Increase gait velocity to >/= 3.6 ft/sec without device.  (01-27-15)   Status New   PT LONG TERM GOAL #6   Title Negotiate steps using a step over step sequence without rail for ascension and step by step sequence without rail for descension.  (01-27-15)   Status New   PT LONG TERM GOAL #7   Title Increase balance so pt able to perform SLS on LLE for >/= 3.0 secs to demo improved balance.  (01-27-15)   Status New               Plan - 02/12/15 1953    Clinical Impression Statement Pt cont to increase strength in bil. LE's - progressing towards LTG's   Pt will benefit from skilled  therapeutic intervention in order to improve on the following deficits Abnormal gait;Decreased balance;Decreased strength   Rehab Potential Good   PT Frequency 2x / week   PT Duration 4 weeks   PT Treatment/Interventions ADLs/Self Care Home Management;Gait training;Stair training;Functional mobility training;Therapeutic activities;Therapeutic exercise;Balance training;Patient/family education;Neuromuscular re-education   PT Next Visit Plan cont ther ex   PT Home Exercise Plan see above   Consulted and Agree with Plan of Care Patient        Problem List There are no active problems to display for this patient.   Alda Lea, PT 02/12/2015, 8:01 PM  [facility] [address] [place], [place], [address] Phone: [phone]   Fax:  [phone]  Name: MINDA FAAS MRN: [id number] Date of Birth: 1940/03/13

## 2015-02-13 ENCOUNTER — Ambulatory Visit: Payer: Medicare Other | Admitting: Physical Therapy

## 2015-02-14 DIAGNOSIS — H6121 Impacted cerumen, right ear: Secondary | ICD-10-CM | POA: Diagnosis not present

## 2015-02-14 DIAGNOSIS — J069 Acute upper respiratory infection, unspecified: Secondary | ICD-10-CM | POA: Diagnosis not present

## 2015-02-14 DIAGNOSIS — H9201 Otalgia, right ear: Secondary | ICD-10-CM | POA: Diagnosis not present

## 2015-02-14 DIAGNOSIS — B029 Zoster without complications: Secondary | ICD-10-CM | POA: Diagnosis not present

## 2015-02-15 ENCOUNTER — Ambulatory Visit: Payer: BLUE CROSS/BLUE SHIELD | Attending: Orthopedic Surgery | Admitting: Physical Therapy

## 2015-02-15 ENCOUNTER — Encounter: Payer: Self-pay | Admitting: Physical Therapy

## 2015-02-15 DIAGNOSIS — R29898 Other symptoms and signs involving the musculoskeletal system: Secondary | ICD-10-CM

## 2015-02-15 DIAGNOSIS — R269 Unspecified abnormalities of gait and mobility: Secondary | ICD-10-CM | POA: Diagnosis not present

## 2015-02-15 DIAGNOSIS — R2681 Unsteadiness on feet: Secondary | ICD-10-CM | POA: Diagnosis not present

## 2015-02-15 NOTE — Therapy (Signed)
[facility] [address] [place], [place], [address] Phone: [phone]   Fax:  [phone]  Physical Therapy Treatment  Patient Details  Name: Sheri Blevins MRN: [id number] Date of Birth: 01/22/1941 Referring Provider: Dr. Latanya Maudlin  Encounter Date: 02/15/2015      PT End of Session - 02/15/15 1355    Visit Number 14   Number of Visits 17   Date for PT Re-Evaluation 02/23/15   Authorization Type BCBS/Medicare    PT Start Time 0805   PT Stop Time 0847   PT Time Calculation (min) 42 min      History reviewed. No pertinent past medical history.  History reviewed. No pertinent past surgical history.  There were no vitals filed for this visit.  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 Yes   Pain Score 5    Pain Location Ear   Pain Orientation Right   Pain Descriptors / Indicators Burning   Pain Type Neuropathic pain  due to shingles on R side of neck and upper trap   Pain Onset In the past 7 days   Pain Frequency Constant   Pain Relieving Factors medication for the nerve pain    Multiple Pain Sites No                         [facility] Adult PT Treatment/Exercise - 02/15/15 0820    Exercises   Exercises Other Exercises   Other Exercises  Pt performed single limb stance and tandem stance for balance   Lumbar Exercises: Aerobic   Stationary Bike SciFit level 1.5" x 5" with UE's and LE's   Lumbar Exercises: Machines for Strengthening    Leg Press 40# bil. LE 30 reps   Lumbar Exercises: Standing   Heel Raises 10 reps   Lumbar Exercises: Supine   Bent Knee Raise 10 reps  3# weight used on each leg   Bridge 10 reps   Straight Leg Raise 10 reps  3# weight on RLE and on LLE   Other Supine Lumbar Exercises Bridging with hip abdct/adduction and with marching x 5 reps each; bridging with LE extension x 5 reps each leg   Lumbar Exercises: Sidelying   Hip Abduction 5 reps  pt performed 5 reps on RLE, then started coughing (D/C ex)   Knee/Hip Exercises: Standing   Forward Step Up Both;10 reps;Step Height: 6"   Other Standing Knee Exercises pt performed hip flexion, extension and abduction with green theraband x 10 reps each leg      NeuroRe-ed; instructed in SLS and tandem stance - see pt instructions - added these to HEP - with UE support prn for safety          PT Education - 02/15/15 1354    Education provided Yes   Education Details instructed in SLS and tandem stance for HEP   Person(s) Educated Patient   Methods Explanation;Demonstration;Handout  Comprehension Verbalized understanding;Returned demonstration             PT Long Term Goals - 01/17/15 2129    PT LONG TERM GOAL #1   Title Pt will report at least 25% improvement in strength and steadiness with ambulation.  (12-31-14)   Baseline met 12-28-14   Status Achieved   PT LONG TERM GOAL #2   Title Negotiate steps using a step over step sequence without use of rail to demo incr. strength and balance.  (12-31-14)   Baseline not met due to decr. balance - 12-28-14   Status Not Met   PT LONG TERM GOAL #3   Title Increase gait velocity to >/= 3.5 ft/sec without device for improved gait efficiency.  (12-31-14)   Baseline 3.29 ft/sec   Status Not Met   PT LONG TERM GOAL #4   Title Independent in HEP for LLE strengthening.  (12-31-14)   Status Achieved   PT LONG TERM GOAL #5   Title Increase gait velocity to >/= 3.6 ft/sec without device.   (01-27-15)   Status New   PT LONG TERM GOAL #6   Title Negotiate steps using a step over step sequence without rail for ascension and step by step sequence without rail for descension.  (01-27-15)   Status New   PT LONG TERM GOAL #7   Title Increase balance so pt able to perform SLS on LLE for >/= 3.0 secs to demo improved balance.  (01-27-15)   Status New               Plan - 02/15/15 1356    Clinical Impression Statement Pt unable to tolerate strengthening exercises in sidelying and prone positions today due to coughing spell due to cold - requested to perform exercises standing or in seated position after experiencing coughing spell in sidelying position   Pt will benefit from skilled therapeutic intervention in order to improve on the following deficits Abnormal gait;Decreased balance;Decreased strength   Rehab Potential Good   PT Frequency 2x / week   PT Duration 4 weeks   PT Treatment/Interventions ADLs/Self Care Home Management;Gait training;Stair training;Functional mobility training;Therapeutic activities;Therapeutic exercise;Balance training;Patient/family education;Neuromuscular re-education   PT Next Visit Plan trial heel lift in R shoe - pt to wear or bring full shoe rather than a slip on shoe; Reissue strengthening exercises   PT Home Exercise Plan see above   Consulted and Agree with Plan of Care Patient        Problem List There are no active problems to display for this patient.   TGGYIR, SWNIO EVOJJKK, PT 02/15/2015, 2:02 PM  [facility] [address] [place], [place], [address] Phone: [phone]   Fax:  [phone]  Name: SOFI BRYARS MRN: [id number] Date of Birth: 06/28/1940

## 2015-02-15 NOTE — Patient Instructions (Addendum)
Tandem Stance    Right foot in front of left, heel touching toe both feet "straight ahead". Stand on Foot Triangle of Support with both feet. Balance in this position _30__ seconds. Do with left foot in front of right.  Copyright  VHI. All rights reserved.  SINGLE LIMB STANCE    Stance: single leg on floor. Raise leg. Hold _10_ seconds. Repeat with other leg. 1-_2__ reps per set, _2__ sets per day, _5__ days per week  Copyright  VHI. All rights reserved.

## 2015-02-20 ENCOUNTER — Ambulatory Visit: Payer: BLUE CROSS/BLUE SHIELD | Admitting: Physical Therapy

## 2015-02-22 ENCOUNTER — Ambulatory Visit: Payer: BLUE CROSS/BLUE SHIELD | Admitting: Physical Therapy

## 2015-02-22 DIAGNOSIS — R2681 Unsteadiness on feet: Secondary | ICD-10-CM

## 2015-02-22 DIAGNOSIS — R29898 Other symptoms and signs involving the musculoskeletal system: Secondary | ICD-10-CM

## 2015-02-22 DIAGNOSIS — R269 Unspecified abnormalities of gait and mobility: Secondary | ICD-10-CM

## 2015-02-23 DIAGNOSIS — M6281 Muscle weakness (generalized): Secondary | ICD-10-CM | POA: Diagnosis not present

## 2015-02-23 DIAGNOSIS — M4316 Spondylolisthesis, lumbar region: Secondary | ICD-10-CM | POA: Diagnosis not present

## 2015-02-25 ENCOUNTER — Encounter: Payer: Self-pay | Admitting: Physical Therapy

## 2015-02-25 NOTE — Patient Instructions (Signed)
SINGLE LIMB STANCE    Stance: single leg on floor. Raise leg. Hold __10_ seconds. Repeat with other leg. _2__ reps per set, __2_ sets per day, _5Tandem Stance    Right foot in front of left, heel touching toe both feet "straight ahead". Stand on Foot Triangle of Support with both feet. Balance in this position _30__ seconds. Do with left foot in front of right.  Copyright  VHI. All rights reserved.  _5_ days per week  Copyright  VHI. All rights reserved.

## 2015-02-25 NOTE — Therapy (Signed)
[facility] [address] [place], [place], [address] Phone: [phone]   Fax:  [phone]  Physical Therapy Treatment  Patient Details  Name: Sheri Blevins MRN: [id number] Date of Birth: [birth year] Referring Provider: Dr. Latanya Maudlin  Encounter Date: 02/22/2015      PT End of Session - 02/25/15 1754    Visit Number 15   Number of Visits 17   Date for PT Re-Evaluation 02/23/15   Authorization Type BCBS/Medicare    PT Start Time 1450   PT Stop Time 1532   PT Time Calculation (min) 42 min      History reviewed. No pertinent past medical history.  History reviewed. No pertinent past surgical history.  There were no vitals filed for this visit.  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 No/denies                         [facility] Adult PT Treatment/Exercise - 02/25/15 0001    Ambulation/Gait   Ambulation/Gait Yes   Ambulation/Gait Assistance 5: Supervision   Ambulation/Gait Assistance Details --  1/4" lift placed in R shoe   Ambulation Distance (Feet) 240 Feet   Assistive device None   Gait Pattern Within Functional Limits   Stairs Assistance 5: Supervision   Stair Management Technique One rail Left   Number of Stairs 4   Height of Stairs 6   Lumbar Exercises: Machines for Strengthening   Leg Press 40# bil. LE 30 reps   Lumbar Exercises: Standing   Heel Raises 10 reps     Gait velocity 2.96 ft/sec without device                PT Long Term Goals - 02/25/15 1755    PT LONG TERM GOAL #1   Title Pt will report at least 25% improvement in strength and steadiness with ambulation.  (12-31-14)   Baseline pt reports at least 35% - 50% better overall   Status Achieved   PT LONG TERM GOAL #2   Title Negotiate steps using a step over step sequence without use of rail to demo incr. strength and balance.  (12-31-14)   Baseline able to ascend steps reciprocal pattern;  uses step by step sequence with descension due to decr. balance (02-22-15)   Status Partially Met   PT LONG TERM GOAL #3   Title Increase gait velocity to >/= 3.5 ft/sec without device for improved gait efficiency.  (12-31-14)   Baseline 2.96 ft/sec on 02-22-15   Status Not Met   PT LONG TERM GOAL #4   Title Independent in HEP for LLE strengthening.  (12-31-14)   Status Achieved   PT LONG TERM GOAL #5   Title Increase gait velocity to >/= 3.6 ft/sec without device.  (01-27-15)   Baseline 11.09 secs = 2.96 ft/sec with no device   Status Not Met   PT LONG TERM GOAL #6   Title Negotiate steps using a step over step sequence without rail for  ascension and step by step sequence without rail for descension.  (01-27-15)   Baseline rail required for safety due to balance deficits (02-22-15)0-   Status Not Met   PT LONG TERM GOAL #7   Title Increase balance so pt able to perform SLS on LLE for >/= 3.0 secs to demo improved balance.  (01-27-15)   Baseline 2.69 secs on LLE   Status Not Met   PT LONG TERM GOAL #8   Title Perform sit to stand from 18" block to simulate standing from church pew - without UE support - to demo increased LE strength.  (01-27-15)   Status Not Met               Problem List There are no active problems to display for this patient.   PHYSICAL THERAPY DISCHARGE SUMMARY  Visits from Start of Care: 15  Current functional level related to goals / functional outcomes: See above for progress towards goals   Remaining deficits: Continued decr. High level balance skills Continued genu valgus  with bil. Hip weakness; LLE slightly weaker than RLE   Education / Equipment: Pt has been instructed in a HEP for bil. LE strengthening and for high level balance skills Plan: Patient agrees to discharge.  Patient goals were partially met. Patient is being discharged due to meeting the stated rehab goals.  ?????       Pt has maximized functional progress at this time - no further needs identified.  Alda Lea, PT 02/25/2015, 6:05 PM  [facility] [address] [place], [place], [address] Phone: [phone]   Fax:  [phone]  Name: Sheri Blevins MRN: [id number] Date of Birth: 23-Apr-1940

## 2015-03-19 DIAGNOSIS — H53022 Refractive amblyopia, left eye: Secondary | ICD-10-CM | POA: Diagnosis not present

## 2015-03-19 DIAGNOSIS — Z961 Presence of intraocular lens: Secondary | ICD-10-CM | POA: Diagnosis not present

## 2015-04-04 ENCOUNTER — Ambulatory Visit
Admission: RE | Admit: 2015-04-04 | Discharge: 2015-04-04 | Disposition: A | Discharge status: Home / Self Care | Payer: BLUE CROSS/BLUE SHIELD | Source: Ambulatory Visit | Attending: Internal Medicine | Admitting: Internal Medicine

## 2015-04-04 ENCOUNTER — Other Ambulatory Visit: Payer: Self-pay | Admitting: Internal Medicine

## 2015-04-04 DIAGNOSIS — R232 Flushing: Secondary | ICD-10-CM

## 2015-04-04 DIAGNOSIS — T07 Unspecified multiple injuries: Secondary | ICD-10-CM | POA: Diagnosis not present

## 2015-06-06 DIAGNOSIS — F419 Anxiety disorder, unspecified: Secondary | ICD-10-CM | POA: Diagnosis not present

## 2015-06-06 DIAGNOSIS — R232 Flushing: Secondary | ICD-10-CM | POA: Diagnosis not present

## 2015-06-25 IMAGING — CR DG FOOT 2V*R*
2 series · 2 of 2 positions shown · non-contrast
Comparison: None.

ADDENDUM:
The original report was by Dr. King-Light [facility]news. The following
addendum is by Dr. King-Light [facility]:

Impression 1 should read , "1.  Hallux valgus."
CLINICAL DATA: Stepped on shattered glass 2 weeks ago. Pain along
the heel.
EXAM:
RIGHT FOOT - 2 VIEW

[view not recorded (1 of 2)]
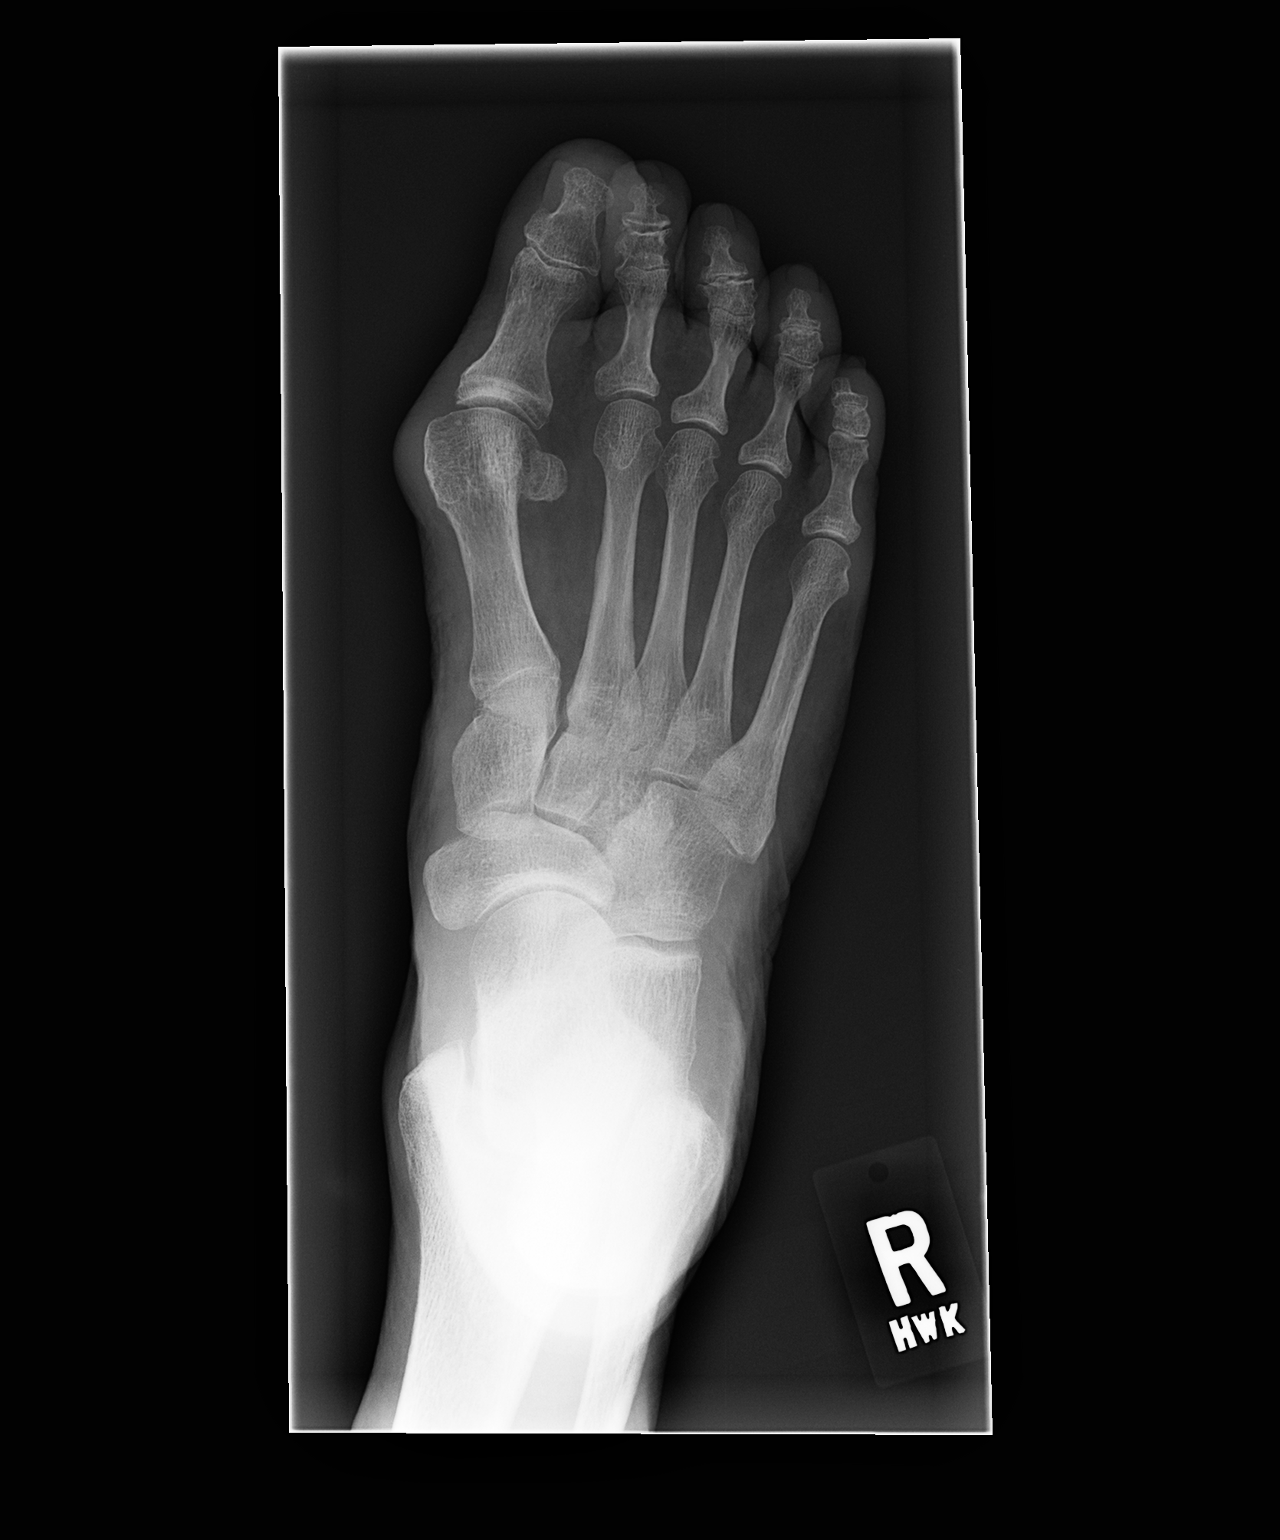

[view not recorded (2 of 2)]
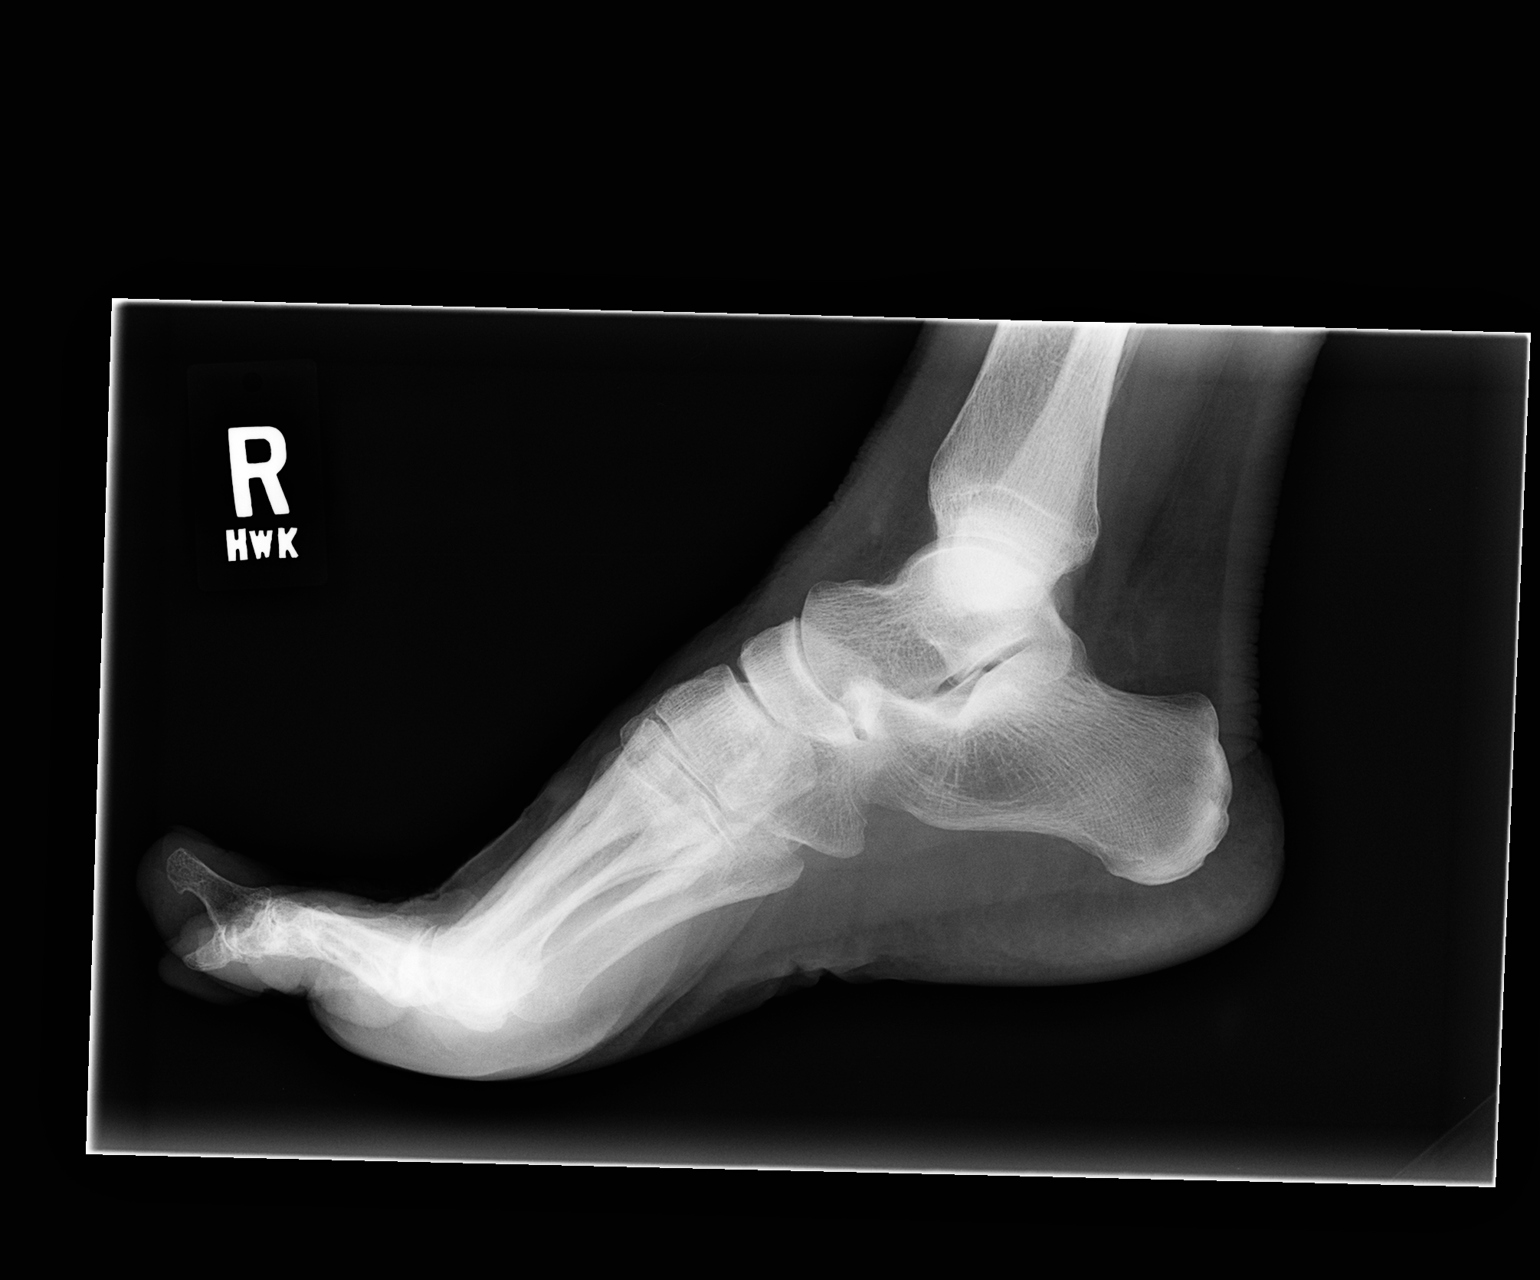

[2 of 2 positions shown; findings below may reference images not displayed]

FINDINGS: Hallux valgus. Degenerative loss of articular space in the
interphalangeal joints. No malalignment at the Lisfranc joint.

On the lateral projection, no definite foreign body is identified.
Accentuated longitudinal arch of the foot may be positional as this
was not a standing view.
IMPRESSION: 1. Hallux of bowel gas.
2. No visible foreign body along the soft tissues of the heel on the
lateral projection.

## 2015-09-03 ENCOUNTER — Other Ambulatory Visit: Payer: Self-pay | Admitting: Internal Medicine

## 2015-09-03 DIAGNOSIS — Z1231 Encounter for screening mammogram for malignant neoplasm of breast: Secondary | ICD-10-CM

## 2015-09-17 DIAGNOSIS — Z96641 Presence of right artificial hip joint: Secondary | ICD-10-CM | POA: Diagnosis not present

## 2015-09-17 DIAGNOSIS — M25552 Pain in left hip: Secondary | ICD-10-CM | POA: Diagnosis not present

## 2015-09-17 DIAGNOSIS — M170 Bilateral primary osteoarthritis of knee: Secondary | ICD-10-CM | POA: Diagnosis not present

## 2015-09-17 DIAGNOSIS — M25561 Pain in right knee: Secondary | ICD-10-CM | POA: Diagnosis not present

## 2015-09-17 DIAGNOSIS — M1712 Unilateral primary osteoarthritis, left knee: Secondary | ICD-10-CM | POA: Diagnosis not present

## 2015-09-17 DIAGNOSIS — M1711 Unilateral primary osteoarthritis, right knee: Secondary | ICD-10-CM | POA: Diagnosis not present

## 2015-09-18 ENCOUNTER — Ambulatory Visit
Admission: RE | Admit: 2015-09-18 | Discharge: 2015-09-18 | Disposition: A | Discharge status: Home / Self Care | Payer: Medicare Other | Source: Ambulatory Visit | Attending: Internal Medicine | Admitting: Internal Medicine

## 2015-09-18 DIAGNOSIS — Z1231 Encounter for screening mammogram for malignant neoplasm of breast: Secondary | ICD-10-CM

## 2015-10-02 DIAGNOSIS — E7800 Pure hypercholesterolemia, unspecified: Secondary | ICD-10-CM | POA: Diagnosis not present

## 2015-10-02 DIAGNOSIS — E559 Vitamin D deficiency, unspecified: Secondary | ICD-10-CM | POA: Diagnosis not present

## 2015-10-02 DIAGNOSIS — K219 Gastro-esophageal reflux disease without esophagitis: Secondary | ICD-10-CM | POA: Diagnosis not present

## 2015-10-02 DIAGNOSIS — Z79899 Other long term (current) drug therapy: Secondary | ICD-10-CM | POA: Diagnosis not present

## 2015-10-02 DIAGNOSIS — F419 Anxiety disorder, unspecified: Secondary | ICD-10-CM | POA: Diagnosis not present

## 2015-10-02 DIAGNOSIS — Z1389 Encounter for screening for other disorder: Secondary | ICD-10-CM | POA: Diagnosis not present

## 2015-10-02 DIAGNOSIS — D81819 Biotin-dependent carboxylase deficiency, unspecified: Secondary | ICD-10-CM | POA: Diagnosis not present

## 2015-10-02 DIAGNOSIS — Z0000 Encounter for general adult medical examination without abnormal findings: Secondary | ICD-10-CM | POA: Diagnosis not present

## 2015-10-02 DIAGNOSIS — E039 Hypothyroidism, unspecified: Secondary | ICD-10-CM | POA: Diagnosis not present

## 2015-11-15 DIAGNOSIS — D692 Other nonthrombocytopenic purpura: Secondary | ICD-10-CM | POA: Diagnosis not present

## 2015-11-15 DIAGNOSIS — L814 Other melanin hyperpigmentation: Secondary | ICD-10-CM | POA: Diagnosis not present

## 2015-11-15 DIAGNOSIS — D1801 Hemangioma of skin and subcutaneous tissue: Secondary | ICD-10-CM | POA: Diagnosis not present

## 2015-11-15 DIAGNOSIS — Z85828 Personal history of other malignant neoplasm of skin: Secondary | ICD-10-CM | POA: Diagnosis not present

## 2015-11-15 DIAGNOSIS — L821 Other seborrheic keratosis: Secondary | ICD-10-CM | POA: Diagnosis not present

## 2015-11-15 DIAGNOSIS — L720 Epidermal cyst: Secondary | ICD-10-CM | POA: Diagnosis not present

## 2016-03-17 DIAGNOSIS — H53022 Refractive amblyopia, left eye: Secondary | ICD-10-CM | POA: Diagnosis not present

## 2016-03-17 DIAGNOSIS — Z961 Presence of intraocular lens: Secondary | ICD-10-CM | POA: Diagnosis not present

## 2016-07-21 DIAGNOSIS — L03116 Cellulitis of left lower limb: Secondary | ICD-10-CM | POA: Diagnosis not present

## 2016-08-22 ENCOUNTER — Other Ambulatory Visit: Payer: Self-pay | Admitting: Internal Medicine

## 2016-08-22 DIAGNOSIS — Z1231 Encounter for screening mammogram for malignant neoplasm of breast: Secondary | ICD-10-CM

## 2016-08-25 ENCOUNTER — Other Ambulatory Visit: Payer: Self-pay | Admitting: Internal Medicine

## 2016-08-25 ENCOUNTER — Ambulatory Visit
Admission: RE | Admit: 2016-08-25 | Discharge: 2016-08-25 | Disposition: A | Discharge status: Home / Self Care | Payer: Medicare Other | Source: Ambulatory Visit | Attending: Internal Medicine | Admitting: Internal Medicine

## 2016-08-25 DIAGNOSIS — S99921A Unspecified injury of right foot, initial encounter: Secondary | ICD-10-CM

## 2016-08-25 DIAGNOSIS — E039 Hypothyroidism, unspecified: Secondary | ICD-10-CM | POA: Diagnosis not present

## 2016-08-25 DIAGNOSIS — S92351A Displaced fracture of fifth metatarsal bone, right foot, initial encounter for closed fracture: Secondary | ICD-10-CM | POA: Diagnosis not present

## 2016-08-26 ENCOUNTER — Ambulatory Visit (INDEPENDENT_AMBULATORY_CARE_PROVIDER_SITE_OTHER): Payer: BLUE CROSS/BLUE SHIELD | Admitting: Sports Medicine

## 2016-08-26 ENCOUNTER — Encounter: Payer: Self-pay | Admitting: Sports Medicine

## 2016-08-26 DIAGNOSIS — S92351A Displaced fracture of fifth metatarsal bone, right foot, initial encounter for closed fracture: Secondary | ICD-10-CM

## 2016-08-26 DIAGNOSIS — M79671 Pain in right foot: Secondary | ICD-10-CM | POA: Diagnosis not present

## 2016-08-26 NOTE — Progress Notes (Signed)
   Subjective:    Patient ID: Sheri Blevins, female    DOB: 1941-02-02, 76 y.o.   MRN: [id number]  HPI   I turned my foot one week ago I finally went to the the doctor yesterday they did x-rays and sent me here to follow up.     Review of Systems  Hematological: Bruises/bleeds easily.  All other systems reviewed and are negative.      Objective:   Physical Exam        Assessment & Plan:

## 2016-08-26 NOTE — Progress Notes (Signed)
Subjective: Sheri Blevins is a 76 y.o. female patient who presents to office for evaluation of Right foot pain. Patient states that 1 week ago she took a misstep out of the kitchen and hurt her foot states that it was swollen and black and blue and applied ACE which helped. However wants Dr. Paulla Dolly to check it. Patient denies any other pedal complaints.   There are no active problems to display for this patient.   Current Outpatient Prescriptions on File Prior to Visit  Medication Sig Dispense Refill  . levothyroxine (SYNTHROID, LEVOTHROID) 50 MCG tablet Take 50 mcg by mouth daily before breakfast.    . PARoxetine (PAXIL-CR) 25 MG 24 hr tablet Take 25 mg by mouth daily.     No current facility-administered medications on file prior to visit.     Allergies  Allergen Reactions  . Codeine Nausea And Vomiting  . Amoxicillin Rash    rash    Objective:  General: Alert and oriented x3 in no acute distress  Dermatology: No open lesions bilateral lower extremities, no webspace macerations, + ecchymosis right foot, all nails x 10 are well manicured.  Vascular: Focal edema noted to right foot. Dorsalis Pedis and Posterior Tibial pedal pulses 1/4, Capillary Fill Time 5 seconds, diminished pedal hair growth bilateral, Temperature gradient within normal limits.  Neurology: Johney Maine sensation intact via light touch bilateral  Musculoskeletal: There is tenderness with palpation at 5th metatarsal distal aspect on right, Bunion deformity, Strength within normal limits in all groups bilateral.   Gait: Donzetta Matters assisted, Antalgic gait   Xray right foot 08-25-16 FINDINGS: Comminuted fracture of the fifth metatarsal is noted. Only minimal displacement is noted. Hallux valgus deformity is seen. No other fractures are noted. Mild soft tissue swelling is noted associated with the fracture.  IMPRESSION: Fifth metatarsal fracture   Electronically Signed   By: Inez Catalina M.D.   On: 08/25/2016  10:37  Assessment and Plan: Problem List Items Addressed This Visit    None    Visit Diagnoses    Closed displaced fracture of fifth metatarsal bone of right foot, initial encounter    -  Primary   Right foot pain           -Complete examination performed -Xrays reviewed -Discussed treatement options for fracture; risks, alternatives, and benefits explained. -Applied compression anklet  -Dispensed post op shoe since patient can not use CAM walker or crutches due to significant upper extremity arthritis and balance issues  -Recommend protection, rest, ice, elevation daily until symptoms improve -Patient prefers to discuss with Dr. Paulla Dolly and will come back to discuss fracture treatment options with him since he did foot surgery in the past  -Patient to return to Dr. Paulla Dolly as requested  or sooner if condition worsens.  Landis Martins, DPM

## 2016-09-08 ENCOUNTER — Ambulatory Visit (INDEPENDENT_AMBULATORY_CARE_PROVIDER_SITE_OTHER): Payer: Medicare Other | Admitting: Podiatry

## 2016-09-08 ENCOUNTER — Encounter: Payer: Self-pay | Admitting: Podiatry

## 2016-09-08 ENCOUNTER — Ambulatory Visit (INDEPENDENT_AMBULATORY_CARE_PROVIDER_SITE_OTHER): Payer: Medicare Other

## 2016-09-08 DIAGNOSIS — S92351A Displaced fracture of fifth metatarsal bone, right foot, initial encounter for closed fracture: Secondary | ICD-10-CM | POA: Diagnosis not present

## 2016-09-09 NOTE — Progress Notes (Signed)
Subjective:    Patient ID: Sheri Blevins, female   DOB: 76 y.o.   MRN: [id number]   HPI patient states she's doing well with her fracture and still having moderate discomfort and swelling but better than it was    ROS      Objective:  Physical Exam neurovascular status intact with continued edema in the lateral side of the right foot secondary to having fracture of the shaft of the fifth metatarsal with patient wearing rigid bottom surgical shoe     Assessment:   Significant fracture shaft fifth metatarsal right      Plan:    H&P x-ray and condition reviewed. I've recommended continued immobilization ice and no types of activity that can creating irritation and will be looked at again in 3 weeks and she understands ultimately it still could require fixation  X-rays indicate that there is gapping on the oblique view but on the AP view it looks good and we will monitor it and hopefully it'll heal uneventfully

## 2016-09-18 ENCOUNTER — Ambulatory Visit
Admission: RE | Admit: 2016-09-18 | Discharge: 2016-09-18 | Disposition: A | Discharge status: Home / Self Care | Payer: Medicare Other | Source: Ambulatory Visit | Attending: Internal Medicine | Admitting: Internal Medicine

## 2016-09-18 DIAGNOSIS — Z1231 Encounter for screening mammogram for malignant neoplasm of breast: Secondary | ICD-10-CM

## 2016-09-29 ENCOUNTER — Encounter: Payer: Self-pay | Admitting: Podiatry

## 2016-09-29 ENCOUNTER — Ambulatory Visit (INDEPENDENT_AMBULATORY_CARE_PROVIDER_SITE_OTHER): Payer: Medicare Other | Admitting: Podiatry

## 2016-09-29 ENCOUNTER — Ambulatory Visit (INDEPENDENT_AMBULATORY_CARE_PROVIDER_SITE_OTHER): Payer: Medicare Other

## 2016-09-29 DIAGNOSIS — S92351A Displaced fracture of fifth metatarsal bone, right foot, initial encounter for closed fracture: Secondary | ICD-10-CM | POA: Diagnosis not present

## 2016-10-01 NOTE — Progress Notes (Signed)
Subjective:    Patient ID: Sheri Blevins, female   DOB: 76 y.o.   MRN: [id number]   HPI patient states my foot seems to be getting some better and I have been wearing my surgical shoe    ROS      Objective:  Physical Exam neurovascular status intact with patient's right lateral foot showing continued edema but improving with mild discomfort upon palpation     Assessment:    Clinical improvement of fracture right fifth metatarsal shaft     Plan:   X-rays taken reviewed continue shoe usage with gradual tennis shoe usage over the next couple weeks  X-rays indicate that there is a healing fracture of the fifth metatarsal shaft right and it does appear to be making good progress

## 2016-10-07 DIAGNOSIS — E7800 Pure hypercholesterolemia, unspecified: Secondary | ICD-10-CM | POA: Diagnosis not present

## 2016-10-07 DIAGNOSIS — Z0000 Encounter for general adult medical examination without abnormal findings: Secondary | ICD-10-CM | POA: Diagnosis not present

## 2016-10-07 DIAGNOSIS — D81819 Biotin-dependent carboxylase deficiency, unspecified: Secondary | ICD-10-CM | POA: Diagnosis not present

## 2016-10-07 DIAGNOSIS — E039 Hypothyroidism, unspecified: Secondary | ICD-10-CM | POA: Diagnosis not present

## 2016-10-07 DIAGNOSIS — F419 Anxiety disorder, unspecified: Secondary | ICD-10-CM | POA: Diagnosis not present

## 2016-10-07 DIAGNOSIS — E559 Vitamin D deficiency, unspecified: Secondary | ICD-10-CM | POA: Diagnosis not present

## 2016-10-07 DIAGNOSIS — K219 Gastro-esophageal reflux disease without esophagitis: Secondary | ICD-10-CM | POA: Diagnosis not present

## 2016-10-07 DIAGNOSIS — Z79899 Other long term (current) drug therapy: Secondary | ICD-10-CM | POA: Diagnosis not present

## 2016-11-03 IMAGING — CR DG CHEST 2V
2 series · 2 of 2 positions shown · non-contrast
Comparison: None.

CLINICAL DATA: Hot flashes

EXAM:
CHEST  2 VIEW

[view not recorded (1 of 2)]
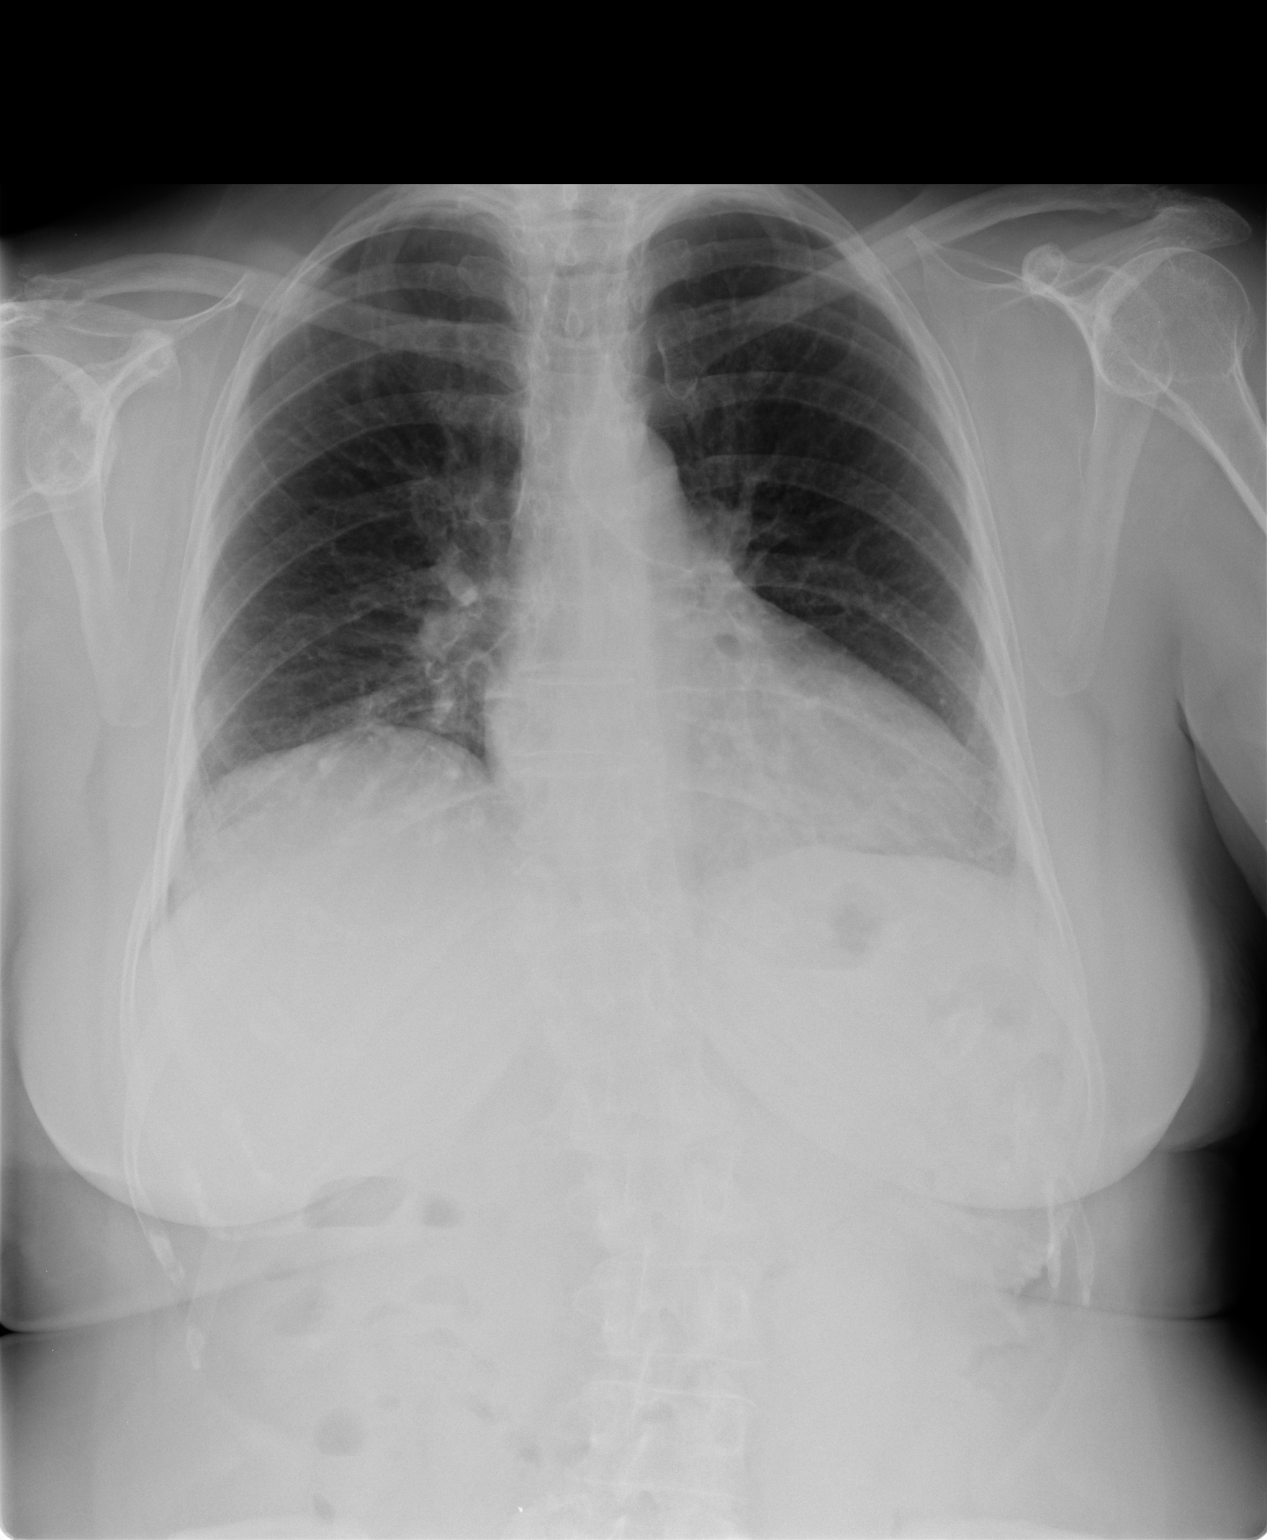

[view not recorded (2 of 2)]
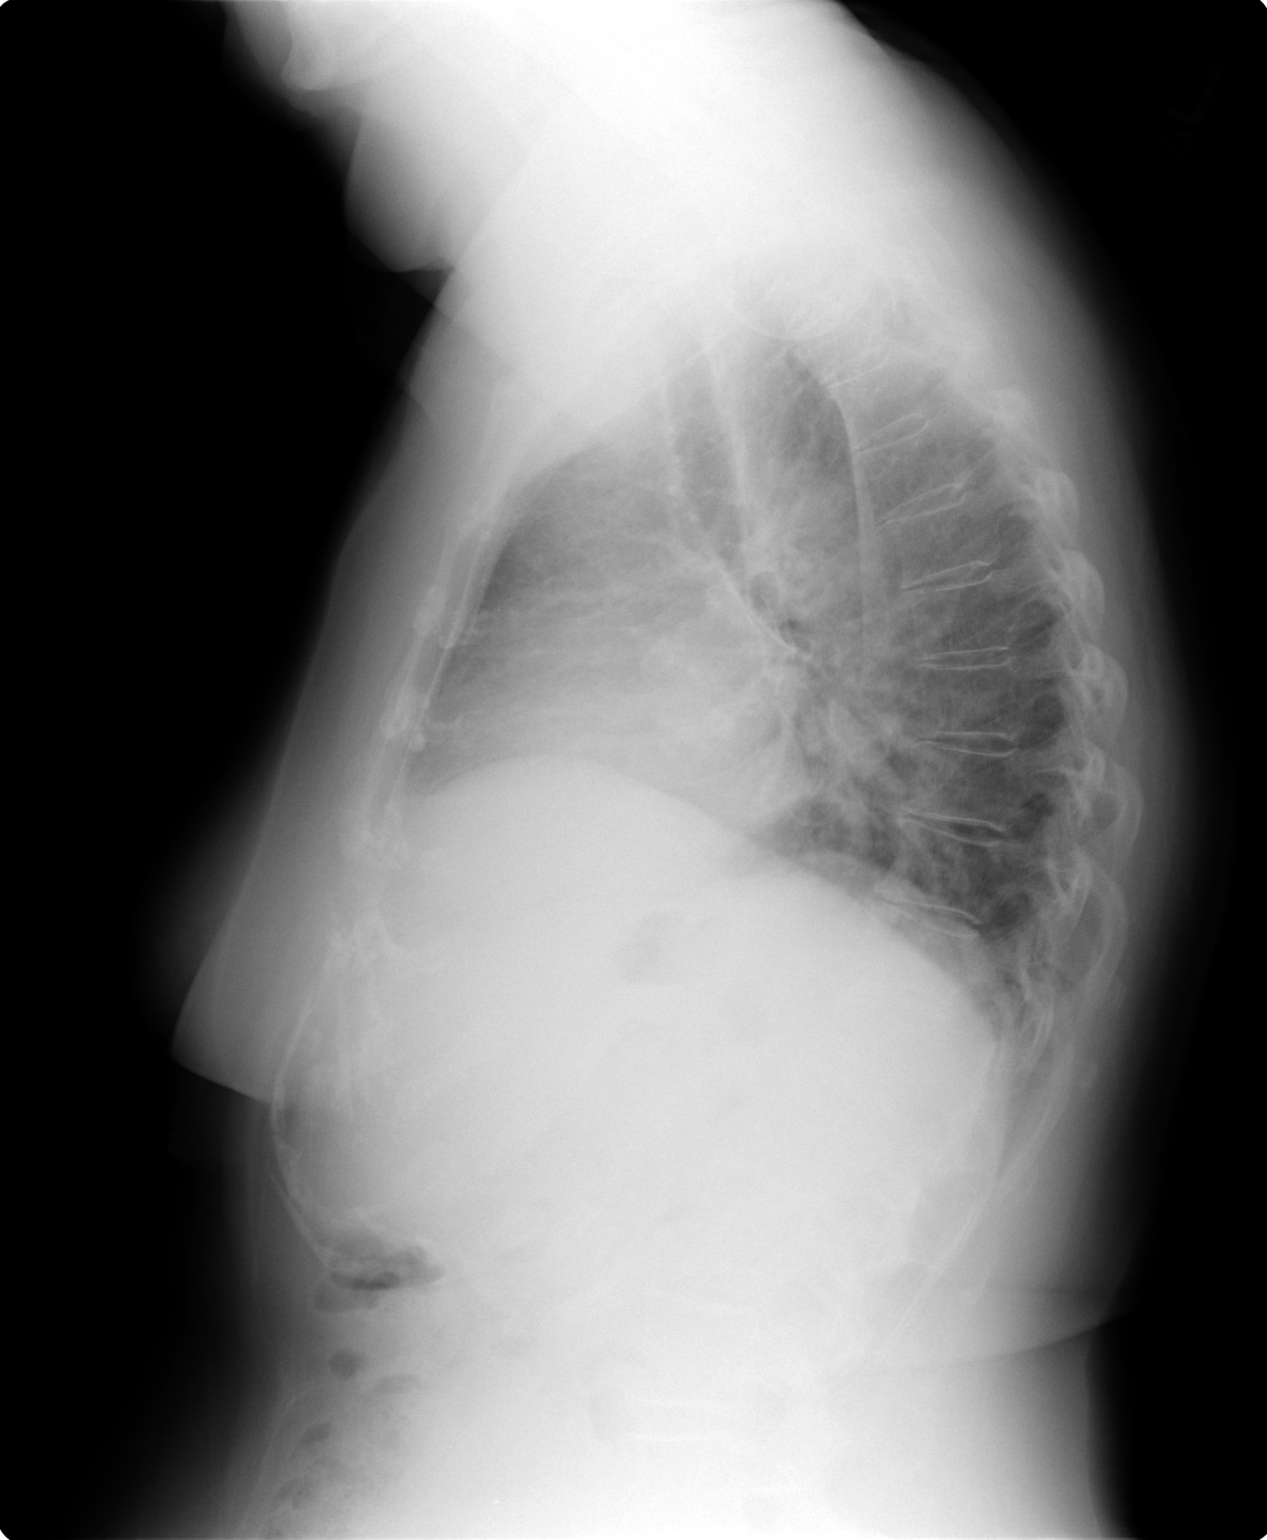

[2 of 2 positions shown; findings below may reference images not displayed]

FINDINGS: Heart is borderline in size. No confluent airspace opacities,
effusions or edema. No acute bony abnormality.
IMPRESSION: Borderline heart size.  No active disease.

## 2016-12-10 DIAGNOSIS — Z23 Encounter for immunization: Secondary | ICD-10-CM | POA: Diagnosis not present

## 2017-11-30 ENCOUNTER — Other Ambulatory Visit: Payer: Self-pay | Admitting: Internal Medicine

## 2017-11-30 DIAGNOSIS — Z1231 Encounter for screening mammogram for malignant neoplasm of breast: Secondary | ICD-10-CM

## 2017-12-01 ENCOUNTER — Ambulatory Visit
Admission: RE | Admit: 2017-12-01 | Discharge: 2017-12-01 | Disposition: A | Discharge status: Home / Self Care | Payer: Medicare Other | Source: Ambulatory Visit | Attending: Internal Medicine | Admitting: Internal Medicine

## 2017-12-01 DIAGNOSIS — Z1231 Encounter for screening mammogram for malignant neoplasm of breast: Secondary | ICD-10-CM

## 2017-12-01 DIAGNOSIS — Z85828 Personal history of other malignant neoplasm of skin: Secondary | ICD-10-CM | POA: Diagnosis not present

## 2017-12-01 DIAGNOSIS — L738 Other specified follicular disorders: Secondary | ICD-10-CM | POA: Diagnosis not present

## 2018-11-18 ENCOUNTER — Other Ambulatory Visit: Payer: Self-pay | Admitting: Internal Medicine

## 2018-11-18 DIAGNOSIS — Z1231 Encounter for screening mammogram for malignant neoplasm of breast: Secondary | ICD-10-CM

## 2019-01-04 ENCOUNTER — Ambulatory Visit: Payer: Medicare Other

## 2019-01-05 ENCOUNTER — Other Ambulatory Visit: Payer: Self-pay | Admitting: Gastroenterology

## 2019-01-05 DIAGNOSIS — R195 Other fecal abnormalities: Secondary | ICD-10-CM

## 2019-02-22 ENCOUNTER — Ambulatory Visit
Admission: RE | Admit: 2019-02-22 | Discharge: 2019-02-22 | Disposition: A | Discharge status: Home / Self Care | Payer: Medicare Other | Source: Ambulatory Visit | Attending: Gastroenterology | Admitting: Gastroenterology

## 2019-02-22 DIAGNOSIS — R195 Other fecal abnormalities: Secondary | ICD-10-CM

## 2019-03-01 ENCOUNTER — Other Ambulatory Visit: Payer: Self-pay

## 2019-03-01 ENCOUNTER — Ambulatory Visit
Admission: RE | Admit: 2019-03-01 | Discharge: 2019-03-01 | Disposition: A | Discharge status: Home / Self Care | Payer: Medicare Other | Source: Ambulatory Visit | Attending: Internal Medicine | Admitting: Internal Medicine

## 2019-03-01 DIAGNOSIS — Z1231 Encounter for screening mammogram for malignant neoplasm of breast: Secondary | ICD-10-CM

## 2020-11-27 ENCOUNTER — Ambulatory Visit
Admission: RE | Admit: 2020-11-27 | Discharge: 2020-11-27 | Disposition: A | Discharge status: Home / Self Care | Payer: Medicare Other | Source: Ambulatory Visit | Attending: Internal Medicine | Admitting: Internal Medicine

## 2020-11-27 ENCOUNTER — Other Ambulatory Visit: Payer: Self-pay | Admitting: Internal Medicine

## 2020-11-27 DIAGNOSIS — R0609 Other forms of dyspnea: Secondary | ICD-10-CM
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
## 2021-05-28 ENCOUNTER — Ambulatory Visit (INDEPENDENT_AMBULATORY_CARE_PROVIDER_SITE_OTHER): Payer: Medicare Other | Admitting: Internal Medicine

## 2021-05-28 ENCOUNTER — Encounter: Payer: Self-pay | Admitting: Internal Medicine

## 2021-05-28 VITALS — BP 140/77 | HR 64 | Ht 60.0 in | Wt 139.0 lb

## 2021-05-28 DIAGNOSIS — R011 Cardiac murmur, unspecified: Secondary | ICD-10-CM | POA: Diagnosis not present

## 2021-05-28 DIAGNOSIS — R072 Precordial pain: Secondary | ICD-10-CM | POA: Insufficient documentation

## 2021-05-28 DIAGNOSIS — I10 Essential (primary) hypertension: Secondary | ICD-10-CM | POA: Diagnosis not present

## 2021-05-28 DIAGNOSIS — E782 Mixed hyperlipidemia: Secondary | ICD-10-CM

## 2021-05-28 LAB — BASIC METABOLIC PANEL
BUN/Creatinine Ratio: 36 — ABNORMAL HIGH (ref 12–28)
BUN: 33 mg/dL — ABNORMAL HIGH (ref 8–27)
CO2: 22 mmol/L (ref 20–29)
Calcium: 10.3 mg/dL (ref 8.7–10.3)
Chloride: 104 mmol/L (ref 96–106)
Creatinine, Ser: 0.91 mg/dL (ref 0.57–1.00)
Glucose: 110 mg/dL — ABNORMAL HIGH (ref 70–99)
Potassium: 4.7 mmol/L (ref 3.5–5.2)
Sodium: 142 mmol/L (ref 134–144)
eGFR: 63 mL/min/{1.73_m2} (ref >59)

## 2021-05-28 MED ORDER — METOPROLOL TARTRATE 50 MG PO TABS
ORAL_TABLET | ORAL | 0 refills | Status: DC
Start: 2021-05-28 — End: 2021-05-28

## 2021-05-28 MED ORDER — METOPROLOL TARTRATE 50 MG PO TABS: ORAL_TABLET | ORAL | 0 refills | Status: AC

## 2021-05-28 NOTE — Patient Instructions (Addendum)
Medication Instructions:  ?Your physician recommends that you continue on your current medications as directed. Please refer to the Current Medication list given to you today. ? ?*If you need a refill on your cardiac medications before your next appointment, please call your pharmacy* ? ? ?Lab Work: ?TODAY: BMP ?If you have labs (blood work) drawn today and your tests are completely normal, you will receive your results only by: ?MyChart Message (if you have MyChart) OR ?A paper copy in the mail ?If you have any lab test that is abnormal or we need to change your treatment, we will call you to review the results. ? ? ?Testing/Procedures: ?Your physician has requested that you have cardiac CT. Cardiac computed tomography (CT) is a painless test that uses an x-ray machine to take clear, detailed pictures of your heart. For further information please visit [email or web address]. Please follow instruction sheet as given. ?  ?Your physician has requested that you have an echocardiogram. Echocardiography is a painless test that uses sound waves to create images of your heart. It provides your doctor with information about the size and shape of your heart and how well your heart?s chambers and valves are working. This procedure takes approximately one hour. There are no restrictions for this procedure. ? ? ? ?Follow-Up: ?At [facility], you and your health needs are our priority.  As part of our continuing mission to provide you with exceptional heart care, we have created designated Provider Care Teams.  These Care Teams include your primary Cardiologist (physician) and Advanced Practice Providers (APPs -  Physician Assistants and Nurse Practitioners) who all work together to provide you with the care you need, when you need it. ? ?We recommend signing up for the patient portal called "MyChart".  Sign up information is provided on this After Visit Summary.  MyChart is used to connect with patients for Virtual Visits  (Telemedicine).  Patients are able to view lab/test results, encounter notes, upcoming appointments, etc.  Non-urgent messages can be sent to your provider as well.   ?To learn more about what you can do with MyChart, go to [email or web address].   ? ?Your next appointment:   ?6-7 month(s) ? ?The format for your next appointment:   ?In Person ? ?Provider:   ?Werner Lean, MD   ? ? ?Other Instructions ? ? ?Your cardiac CT will be scheduled at the below location:  ? ?[facility] ?[address] ?[place], [place] [address] ?[phone] ? ? ?At [facility], please arrive at the [facility] and Children's Entrance (Entrance C2) of [facility] 30 minutes prior to test start time. ?You can use the FREE valet parking offered at entrance C (encouraged to control the heart rate for the test)  ?Proceed to the [facility] Radiology Department (first floor) to check-in and test prep. ? ?All radiology patients and guests should use entrance C2 at [facility], accessed from [facility], even though the hospital's physical address listed is [address]. ? ? ? ? ?Please follow these instructions carefully (unless otherwise directed): ? ?On the Night Before the Test: ?Be sure to Drink plenty of water. ?Do not consume any caffeinated/decaffeinated beverages or chocolate 12 hours prior to your test. ?Do not take any antihistamines 12 hours prior to your test. ? ?On the Day of the Test: ?Drink plenty of water until 1 hour prior to the test. ?Do not eat any food 4 hours prior to the test. ?You may take  your regular medications prior to the test.  ?Take metoprolol (Lopressor) 50 mg by mouth two hours prior to test. ?HOLD Hydrochlorothiazide (Hyzaar) morning of the test. ?FEMALES- please wear underwire-free bra if available, avoid dresses & tight clothing ? ?     ?After the Test: ?Drink plenty of water. ?After receiving IV contrast, you may experience a mild flushed  feeling. This is normal. ?On occasion, you may experience a mild rash up to 24 hours after the test. This is not dangerous. If this occurs, you can take Benadryl 25 mg and increase your fluid intake. ?If you experience trouble breathing, this can be serious. If it is severe call 911 IMMEDIATELY. If it is mild, please call our office. ?If you take any of these medications: Glipizide/Metformin, Avandament, Glucavance, please do not take 48 hours after completing test unless otherwise instructed. ? ?We will call to schedule your test 2-4 weeks out understanding that some insurance companies will need an authorization prior to the service being performed.  ? ?For non-scheduling related questions, please contact the cardiac imaging nurse navigator should you have any questions/concerns: ?Marchia Bond, Cardiac Imaging Nurse Navigator ?Gordy Clement, Cardiac Imaging Nurse Navigator ?[facility] ?Direct Office Dial: [phone]  ? ?For scheduling needs, including cancellations and rescheduling, please call [place], [phone].  ? ?Important Information About Sugar ? ? ? ? ?  ?

## 2021-06-10 ENCOUNTER — Other Ambulatory Visit: Payer: Self-pay | Admitting: Internal Medicine

## 2021-06-12 ENCOUNTER — Ambulatory Visit (HOSPITAL_COMMUNITY): Payer: Medicare Other | Attending: Cardiovascular Disease

## 2021-06-12 DIAGNOSIS — R011 Cardiac murmur, unspecified: Secondary | ICD-10-CM | POA: Diagnosis not present

## 2021-06-12 DIAGNOSIS — R072 Precordial pain: Secondary | ICD-10-CM

## 2021-06-12 DIAGNOSIS — I10 Essential (primary) hypertension: Secondary | ICD-10-CM | POA: Diagnosis not present

## 2021-06-12 LAB — ECHOCARDIOGRAM COMPLETE
Area-P 1/2: 2.13 cm2
MV VTI: 2.44 cm2
S' Lateral: 2.2 cm

## 2021-06-17 ENCOUNTER — Telehealth (HOSPITAL_COMMUNITY): Payer: Self-pay | Admitting: Emergency Medicine

## 2021-06-17 NOTE — Telephone Encounter (Signed)
Reaching out to patient to offer assistance regarding upcoming cardiac imaging study; pt verbalizes understanding of appt date/time, parking situation and where to check in, pre-test NPO status and medications ordered, and verified current allergies; name and call back number provided for further questions should they arise ?Marchia Bond RN Navigator Cardiac Imaging ? [facility] ?[phone] office ?[phone] cell ? ?Holding hyzaar ?'50mg'$  metoprolol tartrate ?Arrival 930 ?Denes iv issues ?

## 2021-06-17 NOTE — Telephone Encounter (Signed)
Unable to leave message ?Marchia Bond RN Navigator Cardiac Imaging ?[facility] ?[phone] Office  ?[phone] Cell  ? ?

## 2021-06-18 ENCOUNTER — Encounter (HOSPITAL_COMMUNITY): Payer: Self-pay

## 2021-06-18 ENCOUNTER — Telehealth: Payer: Self-pay

## 2021-06-18 ENCOUNTER — Ambulatory Visit (HOSPITAL_COMMUNITY)
Admission: RE | Admit: 2021-06-18 | Discharge: 2021-06-18 | Disposition: A | Discharge status: Home / Self Care | Payer: Medicare Other | Source: Ambulatory Visit | Attending: Internal Medicine | Admitting: Internal Medicine

## 2021-06-18 DIAGNOSIS — E782 Mixed hyperlipidemia: Secondary | ICD-10-CM | POA: Insufficient documentation

## 2021-06-18 DIAGNOSIS — R072 Precordial pain: Secondary | ICD-10-CM | POA: Insufficient documentation

## 2021-06-18 MED ORDER — IOHEXOL 350 MG/ML SOLN
100.0000 mL | Freq: Once | INTRAVENOUS | Status: AC | PRN
  Administered 2021-06-18: 100 mL via INTRAVENOUS

## 2021-06-18 MED ORDER — NITROGLYCERIN 0.4 MG SL SUBL
0.8000 mg | SUBLINGUAL_TABLET | Freq: Once | SUBLINGUAL | Status: AC
  Administered 2021-06-18: 0.8 mg via SUBLINGUAL

## 2021-06-18 MED ORDER — NITROGLYCERIN 0.4 MG SL SUBL
SUBLINGUAL_TABLET | SUBLINGUAL | Status: AC
  Filled 2021-06-18: qty 2

## 2021-06-18 MED ORDER — PANTOPRAZOLE SODIUM 40 MG PO TBEC: 40.0000 mg | DELAYED_RELEASE_TABLET | Freq: Every day | ORAL | 5 refills | Status: AC

## 2021-06-18 NOTE — Telephone Encounter (Signed)
-----   Message from Werner Lean, MD sent at 06/18/2021  5:14 PM EDT ----- ?Results: ?No CAD ?Hiatal hernia ?Plan: ?Suggestive of reflux; can do trial of pantoprazole 40 mg PO daily ? ?Werner Lean, MD ? ?

## 2021-06-18 NOTE — Telephone Encounter (Signed)
The patient has been notified of the result and verbalized understanding.  All questions (if any) were answered. ?Michaelyn Barter, RN 06/18/2021 5:18 PM  ? ?

## 2021-11-12 ENCOUNTER — Telehealth: Payer: Self-pay | Admitting: Internal Medicine

## 2021-11-12 NOTE — Telephone Encounter (Signed)
Patient is calling about her appt next week.  She wants to know if her appt is necessary as all her test came back that nothing was wrong.  She also stated she doesn't know if she has someone who can bring her to her appt. Please advise.

## 2021-11-13 NOTE — Telephone Encounter (Signed)
Called pt advised of MD recommendation: If PCP is okay with managing coronary artery disease risk he is okay with pt not following up.  Pt reports Dr. Inda Merlin is no longer her PCP as he has retired however will still go to same office.  Pt would like results of Cardiac CT and Echo results sent to PCP office.  Routed to Dr. Inda Merlin office via [place].   OV canceled per pt request will call back if needs further assistance.

## 2021-11-20 ENCOUNTER — Ambulatory Visit: Payer: Medicare Other | Admitting: Internal Medicine

## 2022-12-23 ENCOUNTER — Other Ambulatory Visit: Payer: Self-pay | Admitting: Internal Medicine

## 2022-12-23 DIAGNOSIS — Z1231 Encounter for screening mammogram for malignant neoplasm of breast: Secondary | ICD-10-CM

## 2023-01-01 ENCOUNTER — Encounter: Payer: Self-pay | Admitting: Internal Medicine

## 2023-01-01 ENCOUNTER — Other Ambulatory Visit: Payer: Self-pay | Admitting: Internal Medicine

## 2023-01-01 DIAGNOSIS — N644 Mastodynia: Secondary | ICD-10-CM

## 2023-02-16 ENCOUNTER — Other Ambulatory Visit: Payer: Medicare Other

## 2023-03-17 ENCOUNTER — Other Ambulatory Visit: Payer: Medicare Other

## 2023-03-18 ENCOUNTER — Ambulatory Visit: Payer: Medicare Other

## 2023-03-18 ENCOUNTER — Ambulatory Visit
Admission: RE | Admit: 2023-03-18 | Discharge: 2023-03-18 | Disposition: A | Discharge status: Home / Self Care | Payer: Medicare Other | Source: Ambulatory Visit | Attending: Internal Medicine | Admitting: Internal Medicine

## 2023-03-18 DIAGNOSIS — N644 Mastodynia: Secondary | ICD-10-CM
